# Patient Record
Sex: Female | Born: 1990 | Race: Asian | Hispanic: No | Marital: Married | Smoking: Never smoker
Health system: Southern US, Community
[De-identification: ages and names within clinical notes are randomized; demographics above are authoritative.]

## PROBLEM LIST (undated history)

## (undated) ENCOUNTER — Inpatient Hospital Stay (HOSPITAL_COMMUNITY): Payer: Self-pay

## (undated) DIAGNOSIS — J45909 Unspecified asthma, uncomplicated: Secondary | ICD-10-CM

## (undated) DIAGNOSIS — D649 Anemia, unspecified: Secondary | ICD-10-CM

## (undated) DIAGNOSIS — R55 Syncope and collapse: Secondary | ICD-10-CM

## (undated) DIAGNOSIS — Z8742 Personal history of other diseases of the female genital tract: Secondary | ICD-10-CM

## (undated) HISTORY — PX: NO PAST SURGERIES: SHX2092

## (undated) HISTORY — DX: Personal history of other diseases of the female genital tract: Z87.42

---
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
## 2009-10-14 ENCOUNTER — Emergency Department (HOSPITAL_COMMUNITY): Admission: EM | Admit: 2009-10-14 | Discharge: 2009-10-15 | Payer: Self-pay | Admitting: Emergency Medicine

## 2009-11-29 ENCOUNTER — Inpatient Hospital Stay (HOSPITAL_COMMUNITY): Admission: AD | Admit: 2009-11-29 | Discharge: 2009-11-29 | Payer: Self-pay | Admitting: Obstetrics & Gynecology

## 2009-11-29 ENCOUNTER — Ambulatory Visit: Payer: Self-pay | Admitting: Family

## 2009-12-01 ENCOUNTER — Inpatient Hospital Stay (HOSPITAL_COMMUNITY): Admission: AD | Admit: 2009-12-01 | Discharge: 2009-12-01 | Payer: Self-pay | Admitting: Obstetrics and Gynecology

## 2009-12-01 ENCOUNTER — Ambulatory Visit: Payer: Self-pay | Admitting: Family Medicine

## 2009-12-05 ENCOUNTER — Inpatient Hospital Stay (HOSPITAL_COMMUNITY): Admission: AD | Admit: 2009-12-05 | Discharge: 2009-12-05 | Payer: Self-pay | Admitting: Obstetrics & Gynecology

## 2009-12-08 ENCOUNTER — Inpatient Hospital Stay (HOSPITAL_COMMUNITY): Admission: AD | Admit: 2009-12-08 | Discharge: 2009-12-08 | Payer: Self-pay | Admitting: Obstetrics and Gynecology

## 2009-12-12 ENCOUNTER — Inpatient Hospital Stay (HOSPITAL_COMMUNITY): Admission: AD | Admit: 2009-12-12 | Discharge: 2009-12-12 | Payer: Self-pay | Admitting: Obstetrics & Gynecology

## 2009-12-14 ENCOUNTER — Ambulatory Visit (HOSPITAL_COMMUNITY): Admission: AD | Admit: 2009-12-14 | Discharge: 2009-12-14 | Payer: Self-pay | Admitting: Obstetrics & Gynecology

## 2009-12-22 ENCOUNTER — Ambulatory Visit: Payer: Self-pay | Admitting: Obstetrics & Gynecology

## 2010-09-18 IMAGING — US US OB TRANSVAGINAL MODIFY
1 series · 14 of 28 positions shown · non-contrast
Comparison: None

CLINICAL DATA: Positive pregnancy test.  Spotting.  Cramping. Beta
HCG of 5335

OB ULTRASOUND < 14 WEEKS, TRANSABDOMINAL AND TRANSVAGINAL
TECHNIQUE: Multiplanar gray scale ultrasound of the uterus and
adnexa was performed from a transvaginal and transabdominal
approach.

[Series 1: us ob comp less 14 wks · 0.21mm/px · 14 of 38 slices shown]
[im 2/38]
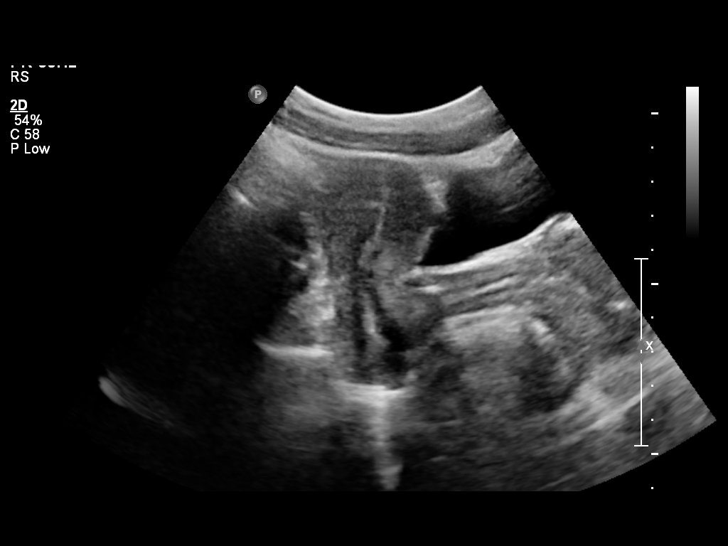
[im 5/38]
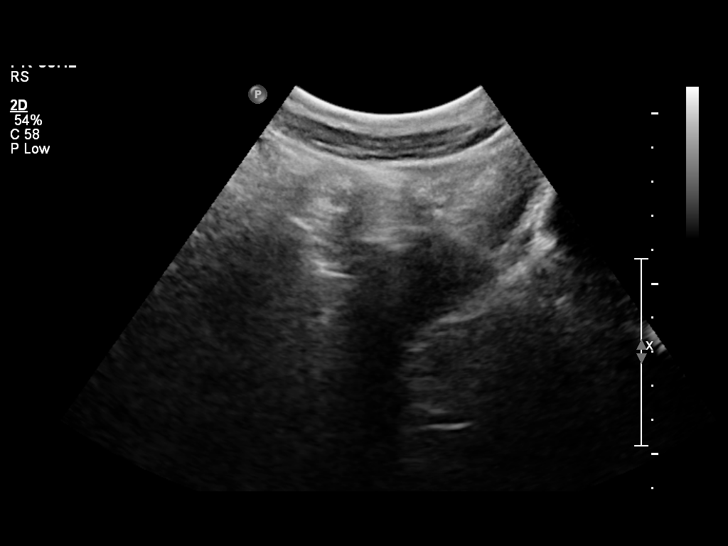
[im 7/38]
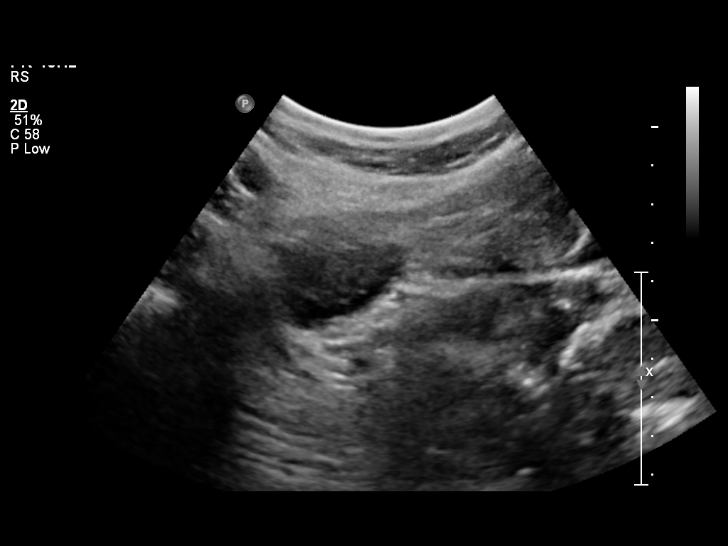
[im 10/38]
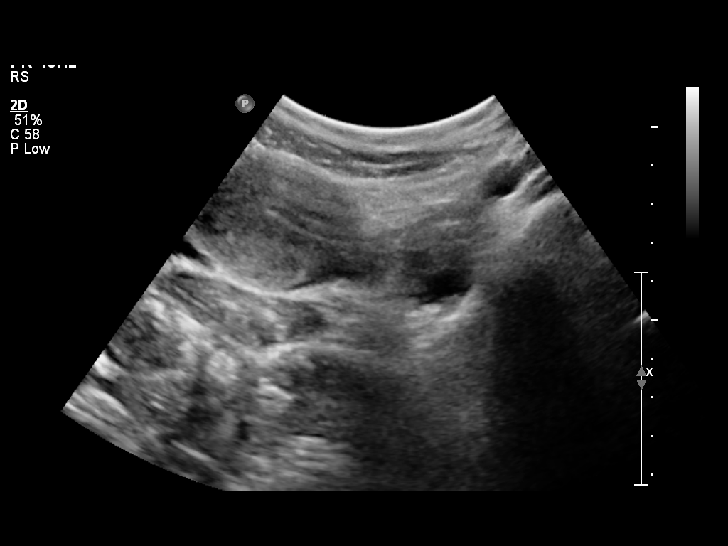
[im 13/38]
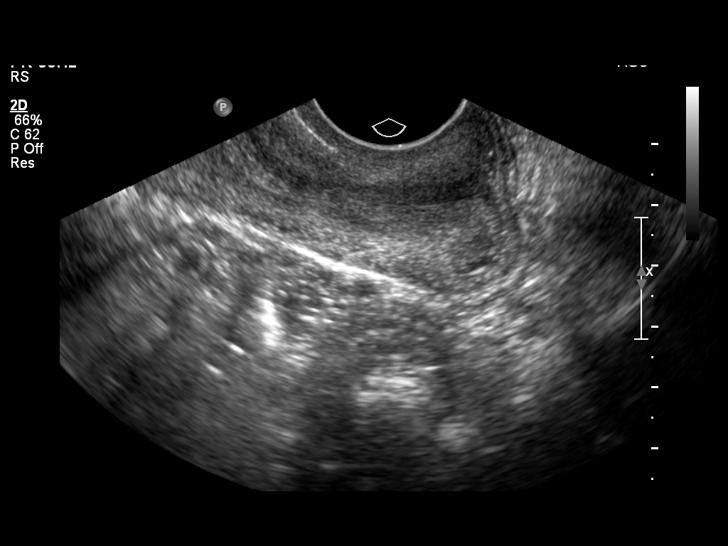
[im 16/38]
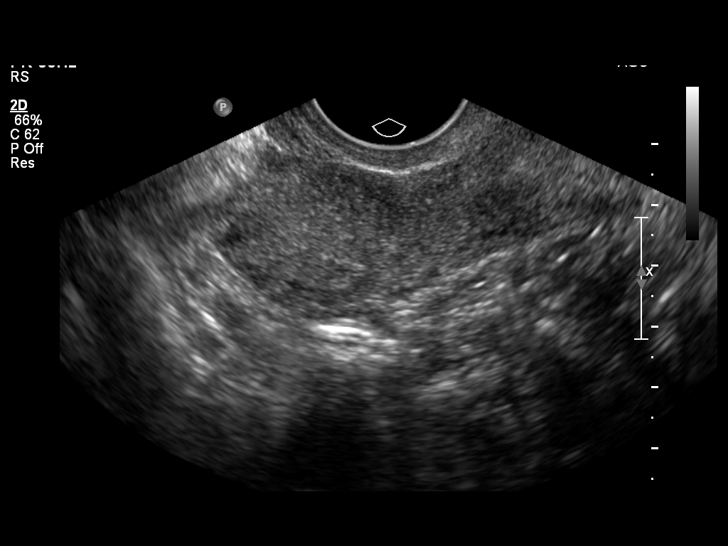
[im 18/38]
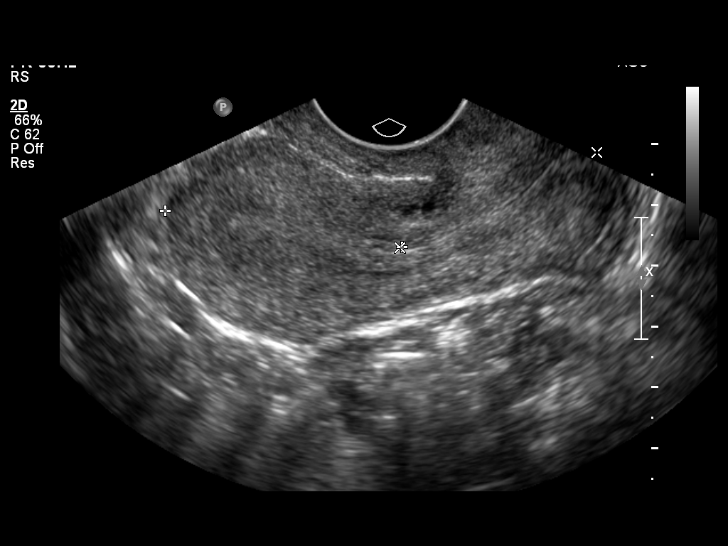
[im 21/38]
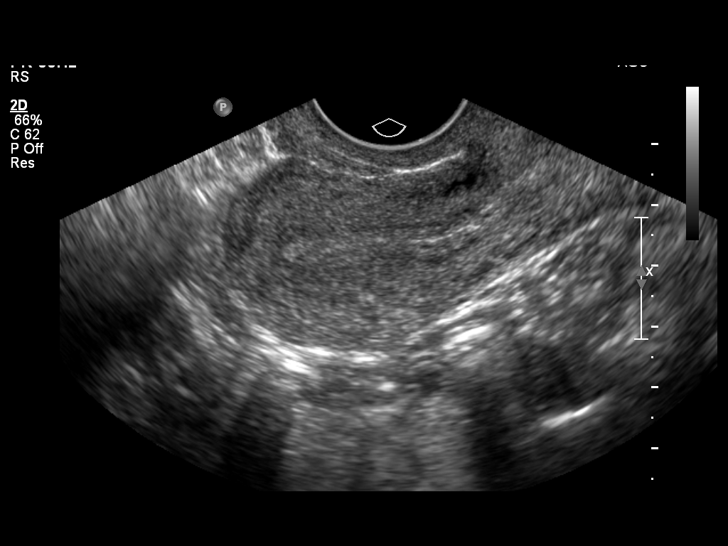
[im 24/38]
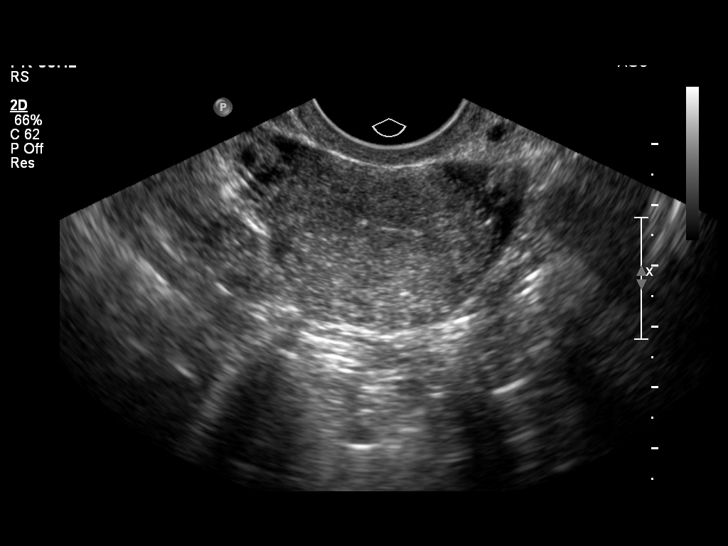
[im 27/38]
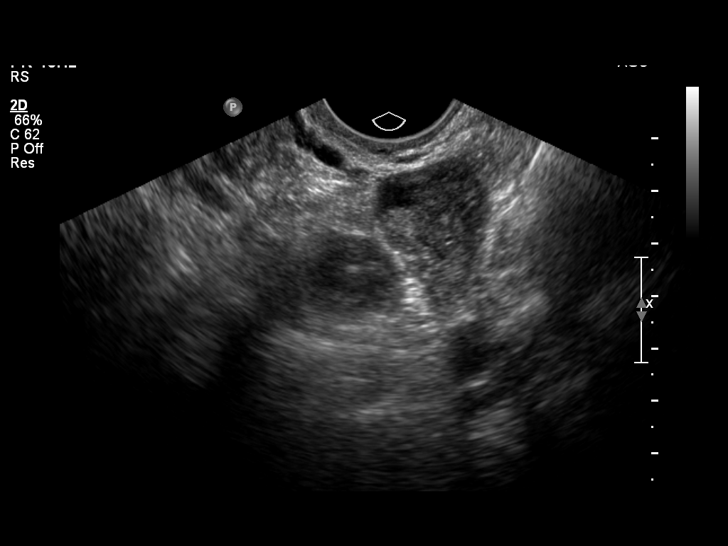
[im 29/38]
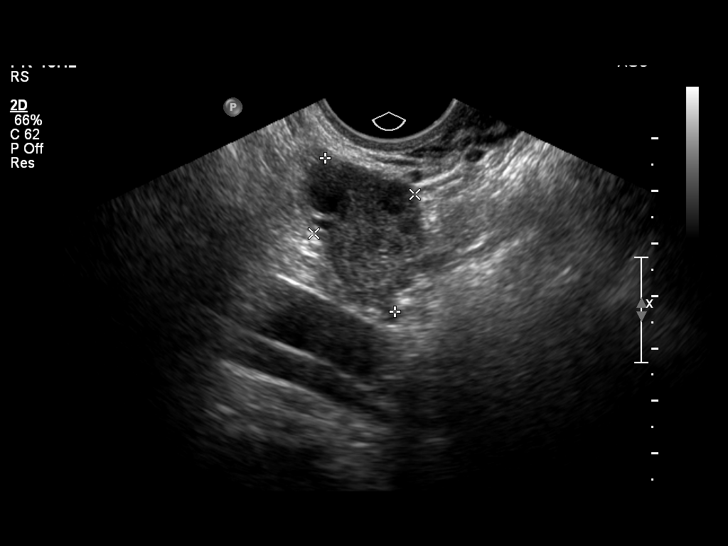
[im 32/38]
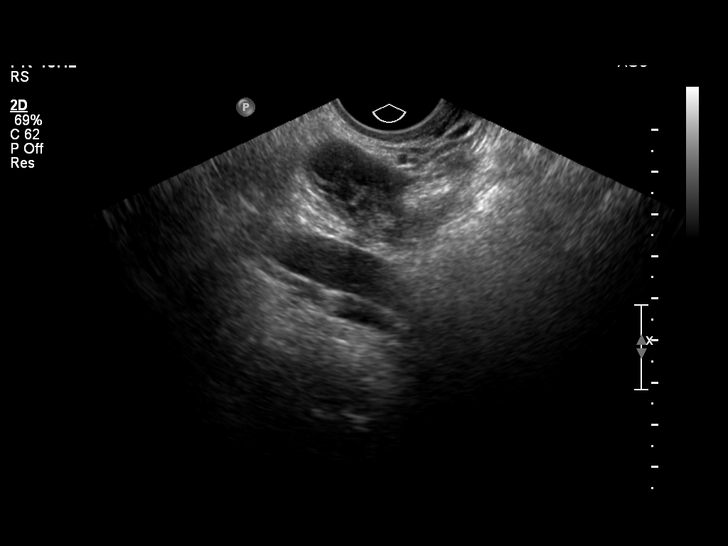
[im 35/38]
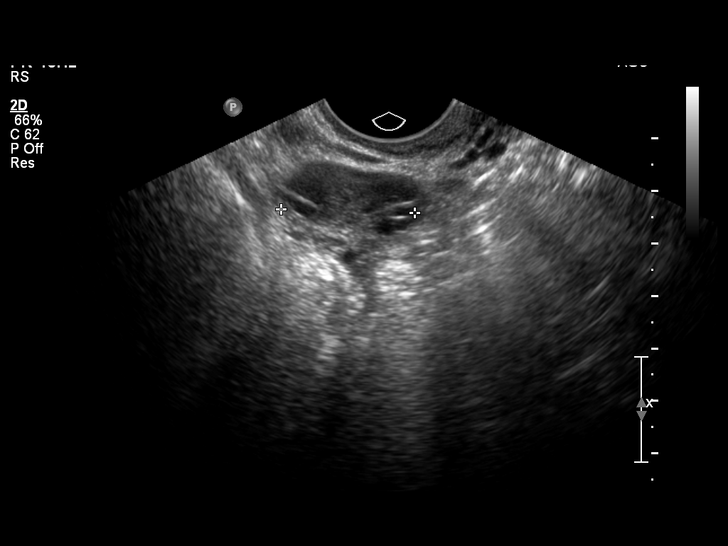
[im 38/38]
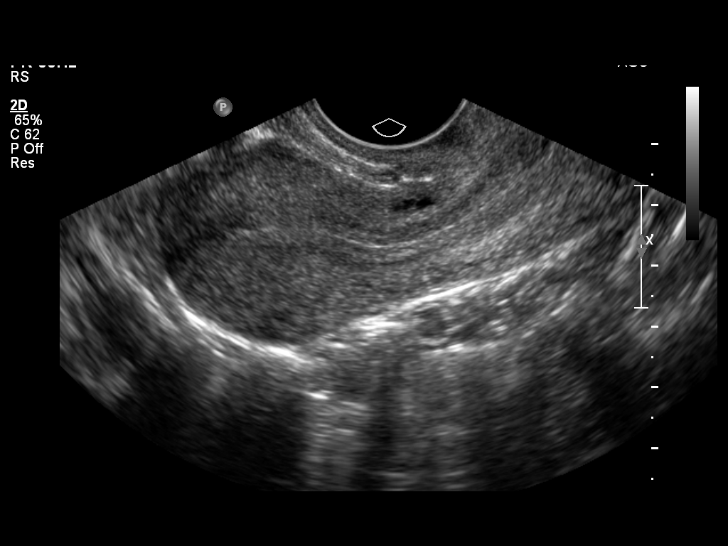

[14 of 28 positions shown; findings below may reference images not displayed]

FINDINGS: No intrauterine gestational sac identified.  Endometrium
at 5 - 6 mm, normal for age.  No yolk sac, fetal pole, or cardiac
activity identified.

The left ovary measures 2.2 x 3.2 x 2.1 cm and is normal in
morphology.

The right ovary measures 2.5 x 3.6 x 1.8 cm and is normal in
morphology.

No significant free fluid.

IMPRESSION
Normal pelvic ultrasound for patient age.  No evidence of
intrauterine gestation or adnexal/ovarian mass.  Differential
considerations include early intrauterine pregnancy, spontaneous
abortion, and ectopic pregnancy.  Recommend ultrasound and/or
clinical follow-up.

## 2011-02-14 LAB — CBC
HCT: 34.2 % — ABNORMAL LOW (ref 36.0–46.0)
HCT: 36.5 % (ref 36.0–46.0)
Hemoglobin: 12 g/dL (ref 12.0–15.0)
MCHC: 33.7 g/dL (ref 30.0–36.0)
MCHC: 33.9 g/dL (ref 30.0–36.0)
MCV: 91.4 fL (ref 78.0–100.0)
MCV: 92.3 fL (ref 78.0–100.0)
Platelets: 197 10*3/uL (ref 150–400)
RBC: 3.99 MIL/uL (ref 3.87–5.11)
RDW: 13.5 % (ref 11.5–15.5)
RDW: 13.9 % (ref 11.5–15.5)
WBC: 5.3 10*3/uL (ref 4.0–10.5)
WBC: 6.4 10*3/uL (ref 4.0–10.5)

## 2011-02-14 LAB — POCT PREGNANCY, URINE: Preg Test, Ur: POSITIVE

## 2011-02-14 LAB — AST
AST: 20 U/L (ref 0–37)
AST: 25 U/L (ref 0–37)

## 2011-02-14 LAB — WET PREP, GENITAL: Yeast Wet Prep HPF POC: NONE SEEN

## 2011-02-14 LAB — DIFFERENTIAL
Eosinophils Absolute: 0.1 10*3/uL (ref 0.0–0.7)
Eosinophils Relative: 1 % (ref 0–5)
Lymphs Abs: 1.9 10*3/uL (ref 0.7–4.0)
Monocytes Relative: 9 % (ref 3–12)

## 2011-02-14 LAB — CREATININE, SERUM
Creatinine, Ser: 0.43 mg/dL (ref 0.4–1.2)
GFR calc non Af Amer: >60 mL/min (ref >60)
GFR calc non Af Amer: >60 mL/min (ref >60)

## 2011-02-14 LAB — URINALYSIS, ROUTINE W REFLEX MICROSCOPIC: pH: 7.5 (ref 5.0–8.0)

## 2011-02-14 LAB — HCG, QUANTITATIVE, PREGNANCY
hCG, Beta Chain, Quant, S: 1609 m[IU]/mL — ABNORMAL HIGH (ref <5)
hCG, Beta Chain, Quant, S: 1915 m[IU]/mL — ABNORMAL HIGH (ref <5)
hCG, Beta Chain, Quant, S: 948 m[IU]/mL — ABNORMAL HIGH (ref <5)

## 2011-02-14 LAB — GC/CHLAMYDIA PROBE AMP, GENITAL: GC Probe Amp, Genital: NEGATIVE

## 2011-02-14 LAB — BUN: BUN: 6 mg/dL (ref 6–23)

## 2011-02-15 LAB — HCG, QUANTITATIVE, PREGNANCY: hCG, Beta Chain, Quant, S: 924 m[IU]/mL — ABNORMAL HIGH (ref <5)

## 2011-03-03 LAB — URINALYSIS, ROUTINE W REFLEX MICROSCOPIC
Bilirubin Urine: NEGATIVE
Hgb urine dipstick: NEGATIVE
Nitrite: NEGATIVE
Specific Gravity, Urine: 1.027 (ref 1.005–1.030)
pH: 6 (ref 5.0–8.0)

## 2011-03-03 LAB — DIFFERENTIAL
Eosinophils Absolute: 0 10*3/uL (ref 0.0–0.7)
Lymphocytes Relative: 19 % (ref 12–46)
Lymphs Abs: 1.4 10*3/uL (ref 0.7–4.0)
Monocytes Relative: 3 % (ref 3–12)
Neutro Abs: 5.5 10*3/uL (ref 1.7–7.7)
Neutrophils Relative %: 76 % (ref 43–77)

## 2011-03-03 LAB — POCT I-STAT, CHEM 8
BUN: 6 mg/dL (ref 6–23)
Chloride: 105 mEq/L (ref 96–112)
Creatinine, Ser: 0.5 mg/dL (ref 0.4–1.2)
Glucose, Bld: 96 mg/dL (ref 70–99)
Potassium: 3.7 mEq/L (ref 3.5–5.1)

## 2011-03-03 LAB — CBC
MCV: 92.4 fL (ref 78.0–100.0)
RBC: 4.11 MIL/uL (ref 3.87–5.11)
WBC: 7.2 10*3/uL (ref 4.0–10.5)

## 2011-08-24 LAB — URINALYSIS, ROUTINE W REFLEX MICROSCOPIC
Glucose, UA: NEGATIVE
Nitrite: NEGATIVE
Specific Gravity, Urine: 1.019
pH: 6.5

## 2011-08-24 LAB — GC/CHLAMYDIA PROBE AMP, GENITAL
Chlamydia, DNA Probe: POSITIVE — AB
GC Probe Amp, Genital: NEGATIVE

## 2011-08-24 LAB — WET PREP, GENITAL: Trich, Wet Prep: NONE SEEN

## 2011-08-24 LAB — URINE MICROSCOPIC-ADD ON

## 2011-08-24 LAB — URINE CULTURE

## 2011-08-25 LAB — POCT URINALYSIS DIP (DEVICE)
Bilirubin Urine: NEGATIVE
Ketones, ur: NEGATIVE
Operator id: 247071
Protein, ur: NEGATIVE
Specific Gravity, Urine: 1.02
pH: 7

## 2011-08-25 LAB — HCG, SERUM, QUALITATIVE: Preg, Serum: NEGATIVE

## 2013-09-06 ENCOUNTER — Encounter (HOSPITAL_COMMUNITY): Payer: Self-pay | Admitting: *Deleted

## 2013-09-06 ENCOUNTER — Inpatient Hospital Stay (HOSPITAL_COMMUNITY): Payer: 59

## 2013-09-06 ENCOUNTER — Inpatient Hospital Stay (HOSPITAL_COMMUNITY)
Admission: AD | Admit: 2013-09-06 | Discharge: 2013-09-06 | Disposition: A | Discharge status: Home / Self Care | Payer: 59 | Source: Ambulatory Visit | Attending: Obstetrics & Gynecology | Admitting: Obstetrics & Gynecology

## 2013-09-06 DIAGNOSIS — O99891 Other specified diseases and conditions complicating pregnancy: Secondary | ICD-10-CM | POA: Insufficient documentation

## 2013-09-06 DIAGNOSIS — R109 Unspecified abdominal pain: Secondary | ICD-10-CM | POA: Insufficient documentation

## 2013-09-06 DIAGNOSIS — O26899 Other specified pregnancy related conditions, unspecified trimester: Secondary | ICD-10-CM

## 2013-09-06 HISTORY — DX: Syncope and collapse: R55

## 2013-09-06 HISTORY — DX: Unspecified asthma, uncomplicated: J45.909

## 2013-09-06 HISTORY — DX: Anemia, unspecified: D64.9

## 2013-09-06 LAB — URINALYSIS, ROUTINE W REFLEX MICROSCOPIC
Leukocytes, UA: NEGATIVE
Nitrite: NEGATIVE

## 2013-09-06 LAB — WET PREP, GENITAL: Yeast Wet Prep HPF POC: NONE SEEN

## 2013-09-06 LAB — CBC
HCT: 35.5 % — ABNORMAL LOW (ref 36.0–46.0)
Hemoglobin: 12.2 g/dL (ref 12.0–15.0)
MCHC: 34.4 g/dL (ref 30.0–36.0)
RBC: 4.02 MIL/uL (ref 3.87–5.11)

## 2013-09-06 LAB — HCG, QUANTITATIVE, PREGNANCY: hCG, Beta Chain, Quant, S: 1681 m[IU]/mL — ABNORMAL HIGH (ref <5)

## 2013-09-06 MED ORDER — ONDANSETRON 8 MG PO TBDP
8.0000 mg | ORAL_TABLET | Freq: Once | ORAL | Status: AC
  Administered 2013-09-06: 8 mg via ORAL
  Filled 2013-09-06: qty 1

## 2013-09-06 NOTE — MAU Provider Note (Signed)
History     CSN: [id number]  Arrival date and time: 09/06/13 1478   First Provider Initiated Contact with Patient 09/06/13 0901      Chief Complaint  Patient presents with  . Abdominal Cramping   HPI  Pt has hx of positive pregnancy test with LMP?07/31/2013.  Pt started having abdominal cramping last night at 11 pm Pt's hx if significant for hx of ectopic pregnancy with MTX and also hx of preterm labor at 19 weeks (carried to 38 weeks). Pt denies UTI symptoms, constipation, diarrhea.  Rn note: Deloris Carolann Littler, RN Registered Nurse Signed  MAU Note Service date: 09/06/2013 8:24 AM   C/o abdominal cramping since 2300 last night;     Past Medical History  Diagnosis Date  . Anemia   . Asthma   . Syncope     Past Surgical History  Procedure Laterality Date  . No past surgeries      Family History  Problem Relation Age of Onset  . Cancer Mother   . Hyperlipidemia Maternal Grandmother     History  Substance Use Topics  . Smoking status: Never Smoker   . Smokeless tobacco: Not on file  . Alcohol Use: No    Allergies:  Allergies  Allergen Reactions  . Fish Allergy Anaphylaxis    Prescriptions prior to admission  Medication Sig Dispense Refill  . Prenatal Vit-Fe Fumarate-FA (PRENATAL MULTIVITAMIN) TABS tablet Take 1 tablet by mouth daily at 12 noon.        ROS Physical Exam   Blood pressure 119/76, pulse 74, temperature 98.2 F (36.8 C), resp. rate 18, height 5\' 6"  (1.676 m), weight 52.164 kg (115 lb), last menstrual period 07/31/2013.  Physical Exam  Nursing note and vitals reviewed. Constitutional: She appears well-developed and well-nourished. No distress.  HENT:  Head: Normocephalic.  Eyes: Pupils are equal, round, and reactive to light.  Neck: Normal range of motion. Neck supple.  Cardiovascular: Normal rate.   Respiratory: Effort normal.  GI: Soft. She exhibits no distension. There is no tenderness. There is no rebound and no guarding.   Genitourinary: Vagina normal and uterus normal.  Musculoskeletal: Normal range of motion.  Neurological: She is alert.  Skin: Skin is warm and dry.  Psychiatric: She has a normal mood and affect.    MAU Course  Procedures Results for orders placed during the hospital encounter of 09/06/13 (from the past 24 hour(s))  CBC     Status: Abnormal   Collection Time    09/06/13  9:15 AM      Result Value Range   WBC 6.0  4.0 - 10.5 K/uL   RBC 4.02  3.87 - 5.11 MIL/uL   Hemoglobin 12.2  12.0 - 15.0 g/dL   HCT 29.5 (*) 62.1 - 30.8 %   MCV 88.3  78.0 - 100.0 fL   MCH 30.3  26.0 - 34.0 pg   MCHC 34.4  30.0 - 36.0 g/dL   RDW 65.7  84.6 - 96.2 %   Platelets 187  150 - 400 K/uL  HCG, QUANTITATIVE, PREGNANCY     Status: Abnormal   Collection Time    09/06/13  9:15 AM      Result Value Range   hCG, Beta Chain, Quant, S 1681 (*) <5 mIU/mL  URINALYSIS, ROUTINE W REFLEX MICROSCOPIC     Status: None   Collection Time    09/06/13  9:20 AM      Result Value Range   Color, Urine YELLOW  YELLOW   APPearance CLEAR  CLEAR   Specific Gravity, Urine 1.010  1.005 - 1.030   pH 6.5  5.0 - 8.0   Glucose, UA NEGATIVE  NEGATIVE mg/dL   Hgb urine dipstick NEGATIVE  NEGATIVE   Bilirubin Urine NEGATIVE  NEGATIVE   Ketones, ur NEGATIVE  NEGATIVE mg/dL   Protein, ur NEGATIVE  NEGATIVE mg/dL   Urobilinogen, UA 0.2  0.0 - 1.0 mg/dL   Nitrite NEGATIVE  NEGATIVE   Leukocytes, UA NEGATIVE  NEGATIVE  WET PREP, GENITAL     Status: Abnormal   Collection Time    09/06/13 10:05 AM      Result Value Range   Yeast Wet Prep HPF POC NONE SEEN  NONE SEEN   Trich, Wet Prep NONE SEEN  NONE SEEN   Clue Cells Wet Prep HPF POC FEW (*) NONE SEEN   WBC, Wet Prep HPF POC TOO NUMEROUS TO COUNT (*) NONE SEEN  US Ob Comp Less 14 Wks  09/06/2013   CLINICAL DATA:  Cramping.  EXAM: OBSTETRIC <14 WK [place] AND TRANSVAGINAL OB US  TECHNIQUE: Both transabdominal and transvaginal ultrasound examinations were performed for complete  evaluation of the gestation as well as the maternal uterus, adnexal regions, and pelvic cul-de-sac. Transvaginal technique was performed to assess early pregnancy.  COMPARISON:  None.  FINDINGS: Intrauterine gestational sac: Not definitely visualized however there is a tiny 0.4 x 0.2 x 0.2 cm oval hypoechoic collection within the uterus possibly representing a very early gestational sac.  Yolk sac:  Not present  Embryo:  Not present  Cardiac Activity: None  Heart Rate: 0 bpm  MSD: 2.7  mm   4 w   5  d  Maternal uterus/adnexae: The right ovary measures 3.6 x 2.5 x 2.3 cm. Simple cyst within the right ovary. The left ovary measures 3.2 x 2.2 x 1.8 cm.  IMPRESSION: Tiny cystic structure within the endometrium may represent a very small gestational sac. However as this is not definitive for this etiology, in the setting of positive pregnancy test and no definite intrauterine pregnancy, this reflects a pregnancy of unknown location. Differential considerations include early normal IUP, abnormal IUP, or nonvisualized ectopic pregnancy. Differentiation is achieved with serial beta HCG supplemented by repeat sonography as clinically warranted.  Discussed with Pamelia Hoit, NP at 11:30 a.m. on September 06, 2013.   Electronically Signed   By: Annia Belt M.D.   On: 09/06/2013 11:35   US Ob Transvaginal  09/06/2013   CLINICAL DATA:  Cramping.  EXAM: OBSTETRIC <14 WK [place] AND TRANSVAGINAL OB US  TECHNIQUE: Both transabdominal and transvaginal ultrasound examinations were performed for complete evaluation of the gestation as well as the maternal uterus, adnexal regions, and pelvic cul-de-sac. Transvaginal technique was performed to assess early pregnancy.  COMPARISON:  None.  FINDINGS: Intrauterine gestational sac: Not definitely visualized however there is a tiny 0.4 x 0.2 x 0.2 cm oval hypoechoic collection within the uterus possibly representing a very early gestational sac.  Yolk sac:  Not present  Embryo:  Not present   Cardiac Activity: None  Heart Rate: 0 bpm  MSD: 2.7  mm   4 w   5  d  Maternal uterus/adnexae: The right ovary measures 3.6 x 2.5 x 2.3 cm. Simple cyst within the right ovary. The left ovary measures 3.2 x 2.2 x 1.8 cm.  IMPRESSION: Tiny cystic structure within the endometrium may represent a very small gestational sac. However as this is not definitive  for this etiology, in the setting of positive pregnancy test and no definite intrauterine pregnancy, this reflects a pregnancy of unknown location. Differential considerations include early normal IUP, abnormal IUP, or nonvisualized ectopic pregnancy. Differentiation is achieved with serial beta HCG supplemented by repeat sonography as clinically warranted.  Discussed with Pamelia Hoit, NP at 11:30 a.m. on September 06, 2013.   Electronically Signed   By: Annia Belt M.D.   On: 09/06/2013 11:35    Assessment and Plan  Abdominal pain in pregnancy ?IUGS- f/u 48 hrs for repeat BetaHCG- sooner if pain or bleeding Pt will need close follow up - hx PTL @19weeks   Catarino Vold 09/06/2013, 9:10 AM

## 2013-09-06 NOTE — MAU Note (Signed)
C/o abdominal cramping since 2300 last night;

## 2013-09-07 LAB — GC/CHLAMYDIA PROBE AMP: CT Probe RNA: NEGATIVE

## 2013-09-08 ENCOUNTER — Inpatient Hospital Stay (HOSPITAL_COMMUNITY)
Admission: AD | Admit: 2013-09-08 | Discharge: 2013-09-08 | Disposition: A | Discharge status: Home / Self Care | Payer: 59 | Source: Ambulatory Visit | Attending: Obstetrics & Gynecology | Admitting: Obstetrics & Gynecology

## 2013-09-08 ENCOUNTER — Encounter (HOSPITAL_COMMUNITY): Payer: Self-pay

## 2013-09-08 DIAGNOSIS — R109 Unspecified abdominal pain: Secondary | ICD-10-CM | POA: Insufficient documentation

## 2013-09-08 DIAGNOSIS — O0281 Inappropriate change in quantitative human chorionic gonadotropin (hCG) in early pregnancy: Secondary | ICD-10-CM | POA: Insufficient documentation

## 2013-09-08 DIAGNOSIS — O99891 Other specified diseases and conditions complicating pregnancy: Secondary | ICD-10-CM | POA: Insufficient documentation

## 2013-09-08 DIAGNOSIS — Z3201 Encounter for pregnancy test, result positive: Secondary | ICD-10-CM

## 2013-09-08 NOTE — MAU Provider Note (Signed)
Attestation of Attending Supervision of Advanced Practitioner (PA/CNM/NP): Evaluation and management procedures were performed by the Advanced Practitioner under my supervision and collaboration.  I have reviewed the Advanced Practitioner's note and chart, and I agree with the management and plan.  Mikaeel Petrow, MD, FACOG Attending Obstetrician & Gynecologist Faculty Practice, [facility]  

## 2013-09-08 NOTE — MAU Note (Signed)
Pt here for f/u only, denies bleeding, does note mild cramping.

## 2013-09-08 NOTE — MAU Provider Note (Signed)
S: 22 y.o. [id number] @[redacted]w[redacted]d  by LMP presents for follow up quant hcg.  On 09/06/13, she had quant hcg of 1681.  U/S showed possible IUGS but inconclusive.  Pt reports mild cramping today, denies bleeding, severe pain, n/v, dizziness, or fever/chills.  O: BP 116/71  Pulse 80  Temp(Src) 97.8 F (36.6 C) (Oral)  Resp 16  Ht 5\' 6"  (1.676 m)  Wt 52.164 kg (115 lb)  BMI 18.57 kg/m2  LMP 07/31/2013  Results for orders placed during the hospital encounter of 09/08/13 (from the past 24 hour(s))  HCG, QUANTITATIVE, PREGNANCY     Status: Abnormal   Collection Time    09/08/13  8:12 AM      Result Value Range   hCG, Beta Chain, Quant, S 4138 (*) <5 mIU/mL   A:  1. Positive blood pregnancy test   2.  Appropriate rise in quant hcg in 48 hours  P: D/C home with bleeding/ectopic precautions Repeat U/S 1 week from previous imaging, on Thursday 10/16 Pt given list of OB providers Return to MAU as needed  Sharen Counter Certified Nurse-Midwife

## 2013-09-10 ENCOUNTER — Other Ambulatory Visit (HOSPITAL_COMMUNITY): Payer: Self-pay | Admitting: Advanced Practice Midwife

## 2013-09-10 DIAGNOSIS — Z3201 Encounter for pregnancy test, result positive: Secondary | ICD-10-CM

## 2013-09-14 ENCOUNTER — Ambulatory Visit (HOSPITAL_COMMUNITY): Payer: 59 | Attending: Advanced Practice Midwife

## 2013-10-16 LAB — OB RESULTS CONSOLE RPR: RPR: NONREACTIVE

## 2013-10-16 LAB — OB RESULTS CONSOLE ABO/RH: RH Type: POSITIVE

## 2013-10-16 LAB — OB RESULTS CONSOLE RUBELLA ANTIBODY, IGM: RUBELLA: IMMUNE

## 2013-10-16 LAB — OB RESULTS CONSOLE HIV ANTIBODY (ROUTINE TESTING): HIV: NONREACTIVE

## 2013-10-16 LAB — OB RESULTS CONSOLE HEPATITIS B SURFACE ANTIGEN: HEP B S AG: NEGATIVE

## 2013-10-16 LAB — OB RESULTS CONSOLE ANTIBODY SCREEN: ANTIBODY SCREEN: NEGATIVE

## 2013-10-16 LAB — OB RESULTS CONSOLE GC/CHLAMYDIA
CHLAMYDIA, DNA PROBE: NEGATIVE
Gonorrhea: NEGATIVE

## 2013-10-16 LAB — OB RESULTS CONSOLE GBS: GBS: NEGATIVE

## 2013-11-29 NOTE — L&D Delivery Note (Signed)
Delivery Note Pt reached complete dilation and pushed well.  At 11:46 AM a healthy female was delivered via Vaginal, Spontaneous Delivery (Presentation: Left Occiput Anterior).  APGAR: 9, 9; weight pending .   Placenta status: Intact, Spontaneous.  Cord: 3 vessels with the following complications: None.    Anesthesia: Epidural  Episiotomy: None Lacerations: 1st degree Suture Repair: 3.0 monocryl Est. Blood Loss (mL): 350cc  Mom to postpartum.  Baby to Couplet care / Skin to Skin.  Oliver Pila 05/06/2014, 12:12 PM

## 2014-01-06 ENCOUNTER — Inpatient Hospital Stay (HOSPITAL_COMMUNITY)
Admission: AD | Admit: 2014-01-06 | Discharge: 2014-01-06 | Disposition: A | Discharge status: Home / Self Care | Payer: 59 | Source: Ambulatory Visit | Attending: Obstetrics and Gynecology | Admitting: Obstetrics and Gynecology

## 2014-01-06 ENCOUNTER — Encounter (HOSPITAL_COMMUNITY): Payer: Self-pay

## 2014-01-06 DIAGNOSIS — O9989 Other specified diseases and conditions complicating pregnancy, childbirth and the puerperium: Principal | ICD-10-CM

## 2014-01-06 DIAGNOSIS — R058 Other specified cough: Secondary | ICD-10-CM

## 2014-01-06 DIAGNOSIS — J069 Acute upper respiratory infection, unspecified: Secondary | ICD-10-CM | POA: Insufficient documentation

## 2014-01-06 DIAGNOSIS — R059 Cough, unspecified: Secondary | ICD-10-CM | POA: Insufficient documentation

## 2014-01-06 DIAGNOSIS — R05 Cough: Secondary | ICD-10-CM | POA: Insufficient documentation

## 2014-01-06 DIAGNOSIS — O99891 Other specified diseases and conditions complicating pregnancy: Secondary | ICD-10-CM | POA: Insufficient documentation

## 2014-01-06 DIAGNOSIS — R55 Syncope and collapse: Secondary | ICD-10-CM

## 2014-01-06 DIAGNOSIS — J029 Acute pharyngitis, unspecified: Secondary | ICD-10-CM

## 2014-01-06 DIAGNOSIS — O2650 Maternal hypotension syndrome, unspecified trimester: Secondary | ICD-10-CM | POA: Insufficient documentation

## 2014-01-06 LAB — URINE MICROSCOPIC-ADD ON

## 2014-01-06 LAB — URINALYSIS, ROUTINE W REFLEX MICROSCOPIC
Bilirubin Urine: NEGATIVE
Glucose, UA: NEGATIVE mg/dL
Hgb urine dipstick: NEGATIVE
KETONES UR: NEGATIVE mg/dL
NITRITE: NEGATIVE
PH: 7 (ref 5.0–8.0)
Protein, ur: NEGATIVE mg/dL
SPECIFIC GRAVITY, URINE: 1.02 (ref 1.005–1.030)
Urobilinogen, UA: 0.2 mg/dL (ref 0.0–1.0)

## 2014-01-06 LAB — RAPID STREP SCREEN (MED CTR MEBANE ONLY): Streptococcus, Group A Screen (Direct): NEGATIVE

## 2014-01-06 LAB — INFLUENZA PANEL BY PCR (TYPE A & B)
H1N1 flu by pcr: NOT DETECTED
Influenza A By PCR: NEGATIVE
Influenza B By PCR: NEGATIVE

## 2014-01-06 MED ORDER — LACTATED RINGERS IV BOLUS (SEPSIS)
1000.0000 mL | Freq: Once | INTRAVENOUS | Status: AC
  Administered 2014-01-06: 1000 mL via INTRAVENOUS

## 2014-01-06 MED ORDER — BENZONATATE 100 MG PO CAPS: 100.0000 mg | ORAL_CAPSULE | Freq: Three times a day (TID) | ORAL | Status: DC | PRN

## 2014-01-06 MED ORDER — GUAIFENESIN 400 MG PO TABS: 800.0000 mg | ORAL_TABLET | ORAL | Status: DC

## 2014-01-06 MED ORDER — AMOXICILLIN 500 MG PO CAPS: 500.0000 mg | ORAL_CAPSULE | Freq: Two times a day (BID) | ORAL | Status: DC

## 2014-01-06 NOTE — MAU Provider Note (Signed)
Chief Complaint:  Cough and Near Syncope   First Provider Initiated Contact with Patient 01/06/14 0601      HPI: Elizabeth Villanueva is a 23 y.o. [phone] at 74w5dwho presents to maternity admissions reporting to MAU with respiratory infection, cough, and near syncope today after coughing spell.  She denies SOB now.  The respiratory infection started on 12/29/13 with sore throat and cough.  She reports fever at home 2 days ago, none since.  Her son had an ear infection 1 week ago.  She reports good fetal movement, denies abdominal pain, LOF, vaginal bleeding, vaginal itching/burning, urinary symptoms, h/a, dizziness, n/v, or fever/chills.     Past Medical History: Past Medical History  Diagnosis Date  . Anemia   . Asthma   . Syncope     Past obstetric history: OB History  Gravida Para Term Preterm AB SAB TAB Ectopic Multiple Living  4 1 1  2 2    1     # Outcome Date GA Lbr Len/2nd Weight Sex Delivery Anes PTL Lv  4 CUR           3 SAB           2 SAB           1 TRM     M SVD   Y      Past Surgical History: Past Surgical History  Procedure Laterality Date  . No past surgeries      Family History: Family History  Problem Relation Age of Onset  . Cancer Mother   . Hyperlipidemia Maternal Grandmother     Social History: History  Substance Use Topics  . Smoking status: Never Smoker   . Smokeless tobacco: Not on file  . Alcohol Use: No    Allergies:  Allergies  Allergen Reactions  . Fish Allergy Anaphylaxis    Meds:  Prescriptions prior to admission  Medication Sig Dispense Refill  . DM-Doxylamine-Acetaminophen (TYLENOL WARMING COUGH NIGHT) 30-12.03-999 MG/30ML LIQD Take 30 mLs by mouth.      . Prenatal Vit-Fe Fumarate-FA (PRENATAL MULTIVITAMIN) TABS tablet Take 1 tablet by mouth daily at 12 noon.      . [DISCONTINUED] guaiFENesin (ROBITUSSIN) 100 MG/5ML liquid Take 200 mg by mouth 3 (three) times daily as needed for cough.        ROS: Pertinent findings in history  of present illness.  Physical Exam  Blood pressure 109/72, pulse 89, temperature 98.2 F (36.8 C), temperature source Oral, resp. rate 18, height 5\' 6"  (1.676 m), weight 65.772 kg (145 lb), last menstrual period 07/31/2013, SpO2 100.00%. GENERAL: Well-developed, well-nourished female in no acute distress.  HEENT: normocephalic HEART: normal rate RESP: normal effort ABDOMEN: Soft, non-tender, gravid appropriate for gestational age EXTREMITIES: Nontender, no edema NEURO: alert and oriented SPECULUM EXAM: NEFG, physiologic discharge, no blood, cervix clean    FHT:  145 by doppler   Labs: Results for orders placed during the hospital encounter of 01/06/14 (from the past 24 hour(s))  URINALYSIS, ROUTINE W REFLEX MICROSCOPIC     Status: Abnormal   Collection Time    01/06/14  5:36 AM      Result Value Range   Color, Urine YELLOW  YELLOW   APPearance CLEAR  CLEAR   Specific Gravity, Urine 1.020  1.005 - 1.030   pH 7.0  5.0 - 8.0   Glucose, UA NEGATIVE  NEGATIVE mg/dL   Hgb urine dipstick NEGATIVE  NEGATIVE   Bilirubin Urine NEGATIVE  NEGATIVE  Ketones, ur NEGATIVE  NEGATIVE mg/dL   Protein, ur NEGATIVE  NEGATIVE mg/dL   Urobilinogen, UA 0.2  0.0 - 1.0 mg/dL   Nitrite NEGATIVE  NEGATIVE   Leukocytes, UA SMALL (*) NEGATIVE  URINE MICROSCOPIC-ADD ON     Status: Abnormal   Collection Time    01/06/14  5:36 AM      Result Value Range   Squamous Epithelial / LPF FEW (*) RARE   WBC, UA 3-6  <3 WBC/hpf   Bacteria, UA FEW (*) RARE   Urine-Other MUCOUS PRESENT      Assessment: 1. Protracted URI   2. Dry cough   3. Sore throat   4. Vasovagal near-syncope     Plan: LR 1000 ml x1 in MAU Consult Dr Ambrose MantleHenley Strep and Influenza rapid tests Discharge home Tessalon Perles for cough Increase PO fluids Continue Robitussin DM F/U in office next week if symptoms persist Return to MAU as needed       Follow-up Information   Follow up with Bing PlumeHENLEY,THOMAS F, MD.   Specialty:   Obstetrics and Gynecology   Contact information:   [address], [address], [address] [place] [phone]        Medication List    STOP taking these medications       guaiFENesin 100 MG/5ML liquid  Commonly known as:  ROBITUSSIN      TAKE these medications       benzonatate 100 MG capsule  Commonly known as:  TESSALON PERLES  Take 1 capsule (100 mg total) by mouth 3 (three) times daily as needed for cough.     prenatal multivitamin Tabs tablet  Take 1 tablet by mouth daily at 12 noon.     TYLENOL WARMING COUGH NIGHT 30-12.03-999 MG/30ML Liqd  Generic drug:  DM-Doxylamine-Acetaminophen  Take 30 mLs by mouth.        Elizabeth Villanueva Certified Nurse-Midwife 01/06/2014 7:24 AM

## 2014-01-06 NOTE — MAU Note (Addendum)
Pt has had cough x 1 week, productive at times. Had fever up until 2 days ago. Sore throat since 1/31. Denies abdominal pain, vaginal bleeding, LOF. Pt states this morning while walking to the bathroom got really dizzy and fell to the ground; unsure how she landed. Denies loss of consciousness.

## 2014-01-06 NOTE — Discharge Instructions (Signed)

## 2014-01-07 LAB — URINE CULTURE
COLONY COUNT: NO GROWTH
CULTURE: NO GROWTH

## 2014-01-08 LAB — CULTURE, GROUP A STREP

## 2014-01-13 ENCOUNTER — Inpatient Hospital Stay (HOSPITAL_COMMUNITY)
Admission: AD | Admit: 2014-01-13 | Discharge: 2014-01-13 | Disposition: A | Discharge status: Home / Self Care | Payer: 59 | Source: Ambulatory Visit | Attending: Obstetrics and Gynecology | Admitting: Obstetrics and Gynecology

## 2014-01-13 DIAGNOSIS — O9989 Other specified diseases and conditions complicating pregnancy, childbirth and the puerperium: Secondary | ICD-10-CM

## 2014-01-13 DIAGNOSIS — N898 Other specified noninflammatory disorders of vagina: Secondary | ICD-10-CM | POA: Insufficient documentation

## 2014-01-13 DIAGNOSIS — R109 Unspecified abdominal pain: Secondary | ICD-10-CM

## 2014-01-13 DIAGNOSIS — O99891 Other specified diseases and conditions complicating pregnancy: Secondary | ICD-10-CM | POA: Insufficient documentation

## 2014-01-13 DIAGNOSIS — O26899 Other specified pregnancy related conditions, unspecified trimester: Secondary | ICD-10-CM

## 2014-01-13 DIAGNOSIS — O4700 False labor before 37 completed weeks of gestation, unspecified trimester: Secondary | ICD-10-CM | POA: Insufficient documentation

## 2014-01-13 LAB — URINALYSIS, ROUTINE W REFLEX MICROSCOPIC
Bilirubin Urine: NEGATIVE
Glucose, UA: 250 mg/dL — AB
Hgb urine dipstick: NEGATIVE
Ketones, ur: NEGATIVE mg/dL
Leukocytes, UA: NEGATIVE
Nitrite: NEGATIVE
Protein, ur: NEGATIVE mg/dL
Specific Gravity, Urine: 1.015 (ref 1.005–1.030)
Urobilinogen, UA: 0.2 mg/dL (ref 0.0–1.0)
pH: 7 (ref 5.0–8.0)

## 2014-01-13 NOTE — MAU Provider Note (Signed)
  History     CSN: [id number]  Arrival date and time: 01/13/14 1420   First Provider Initiated Contact with Patient 01/13/14 1508      Chief Complaint  Patient presents with  . Rupture of Membranes   HPI Elizabeth Villanueva is a 23 y.o. [id number] at 5012w5d. She presents with c/o contractions x 2 days off/on. She had increased vaginal discharge today, clear and mucoid. No bleeding or spotting, no odor or itching.  No urinary urgency, frequency or burning. Good fetal movement. Last intercourse last night.    Past Medical History  Diagnosis Date  . Anemia   . Asthma   . Syncope     Past Surgical History  Procedure Laterality Date  . No past surgeries      Family History  Problem Relation Age of Onset  . Cancer Mother   . Hyperlipidemia Maternal Grandmother     History  Substance Use Topics  . Smoking status: Never Smoker   . Smokeless tobacco: Not on file  . Alcohol Use: No    Allergies:  Allergies  Allergen Reactions  . Fish Allergy Anaphylaxis    Prescriptions prior to admission  Medication Sig Dispense Refill  . albuterol (PROVENTIL HFA;VENTOLIN HFA) 108 (90 BASE) MCG/ACT inhaler Inhale 1-2 puffs into the lungs every 6 (six) hours as needed for wheezing or shortness of breath.      Marland Kitchen. amoxicillin (AMOXIL) 500 MG capsule Take 500 mg by mouth 2 (two) times daily.      . benzonatate (TESSALON PERLES) 100 MG capsule Take 1 capsule (100 mg total) by mouth 3 (three) times daily as needed for cough.  20 capsule  0  . DM-Doxylamine-Acetaminophen (TYLENOL WARMING COUGH NIGHT) 30-12.03-999 MG/30ML LIQD Take 30 mLs by mouth.      Marland Kitchen. guaiFENesin 200 MG tablet Take 400 mg by mouth every 4 (four) hours as needed for cough or to loosen phlegm.      . Prenatal Vit-Fe Fumarate-FA (PRENATAL MULTIVITAMIN) TABS tablet Take 1 tablet by mouth daily at 12 noon.        Review of Systems  Constitutional: Negative for fever and chills.  Gastrointestinal: Negative for heartburn, nausea,  vomiting, diarrhea and constipation.  Genitourinary: Negative for dysuria, urgency and frequency.       Contractions Clear, mucoid discharge   Physical Exam   Temperature 98.4 F (36.9 C), temperature source Oral, resp. rate 16, last menstrual period 07/31/2013.  Physical Exam  Constitutional: She is oriented to person, place, and time. She appears well-developed and well-nourished.  GI: Soft. There is no tenderness.  Genitourinary:  SS exam Ext gen- nl anatomy,skin intact Vagina- scant  Clear mucoid discharge Cx-parous with eversion, soft, thick, long Ut- gravid @ 24 wk size Adn- non tender  Musculoskeletal: Normal range of motion.  Neurological: She is alert and oriented to person, place, and time.  Skin: Skin is warm and dry.  Psychiatric: She has a normal mood and affect. Her behavior is normal.    MAU Course  Procedures  MDM Crist FatFern negative Ext toco- no contractions  Assessment and Plan  ASSESSMENT:  23 5/7 wks, increased vaginal discharge Contractions after intercourse Cervix long, closed  PLAN:  Pelvic rest Keep next appt in the office   Elizabeth Linker M. 01/13/2014, 3:21 PM

## 2014-01-13 NOTE — MAU Note (Signed)
23 yo, G4P1 at 7354w5d, presents to MAU with c/o questionable ROM clear fluid at 1300 today. Denies VB. Reports +FM and intermittent contractions.

## 2014-03-08 ENCOUNTER — Encounter (HOSPITAL_COMMUNITY): Payer: Self-pay

## 2014-03-08 ENCOUNTER — Inpatient Hospital Stay (HOSPITAL_COMMUNITY)
Admission: AD | Admit: 2014-03-08 | Discharge: 2014-03-08 | Disposition: A | Discharge status: Home / Self Care | Payer: 59 | Source: Ambulatory Visit | Attending: Obstetrics and Gynecology | Admitting: Obstetrics and Gynecology

## 2014-03-08 DIAGNOSIS — R8789 Other abnormal findings in specimens from female genital organs: Secondary | ICD-10-CM

## 2014-03-08 DIAGNOSIS — O4700 False labor before 37 completed weeks of gestation, unspecified trimester: Secondary | ICD-10-CM | POA: Insufficient documentation

## 2014-03-08 DIAGNOSIS — O09899 Supervision of other high risk pregnancies, unspecified trimester: Secondary | ICD-10-CM

## 2014-03-08 DIAGNOSIS — R109 Unspecified abdominal pain: Secondary | ICD-10-CM | POA: Insufficient documentation

## 2014-03-08 DIAGNOSIS — O479 False labor, unspecified: Secondary | ICD-10-CM

## 2014-03-08 LAB — WET PREP, GENITAL
Trich, Wet Prep: NONE SEEN
YEAST WET PREP: NONE SEEN

## 2014-03-08 LAB — URINALYSIS, ROUTINE W REFLEX MICROSCOPIC
Bilirubin Urine: NEGATIVE
Glucose, UA: 100 mg/dL — AB
Hgb urine dipstick: NEGATIVE
Ketones, ur: NEGATIVE mg/dL
Leukocytes, UA: NEGATIVE
NITRITE: NEGATIVE
PROTEIN: NEGATIVE mg/dL
Specific Gravity, Urine: 1.015 (ref 1.005–1.030)
UROBILINOGEN UA: 0.2 mg/dL (ref 0.0–1.0)
pH: 6 (ref 5.0–8.0)

## 2014-03-08 LAB — GLUCOSE, CAPILLARY: Glucose-Capillary: 83 mg/dL (ref 70–99)

## 2014-03-08 LAB — FETAL FIBRONECTIN: FETAL FIBRONECTIN: POSITIVE — AB

## 2014-03-08 MED ORDER — TERBUTALINE SULFATE 1 MG/ML IJ SOLN
0.2500 mg | Freq: Once | INTRAMUSCULAR | Status: AC
  Administered 2014-03-08: 0.25 mg via SUBCUTANEOUS
  Filled 2014-03-08: qty 1

## 2014-03-08 MED ORDER — GI COCKTAIL ~~LOC~~
30.0000 mL | Freq: Once | ORAL | Status: AC
  Administered 2014-03-08: 30 mL via ORAL
  Filled 2014-03-08: qty 30

## 2014-03-08 MED ORDER — BETAMETHASONE SOD PHOS & ACET 6 (3-3) MG/ML IJ SUSP
12.0000 mg | Freq: Once | INTRAMUSCULAR | Status: AC
  Administered 2014-03-08: 12 mg via INTRAMUSCULAR
  Filled 2014-03-08: qty 2

## 2014-03-08 NOTE — MAU Note (Signed)
Patient states she has been having frequent contractions since 1520 and now hurting in the back. Denies bleeding, leaking or discharge. Reports good fetal movement.

## 2014-03-08 NOTE — Discharge Instructions (Signed)
Fetal Fibronectin This is a test done to help evaluate a pregnant woman's risk of pre-term delivery. It is generally done when you are 26 to [redacted] weeks pregnant and are having symptoms of premature labor. A Dacron swab is used to take a sample of cervical or vaginal fluid from the back portion of the vagina or from the area just outside the opening of the cervix. Fetal fibronectin (fFN) is a glycoprotein that can be used to help predict the short term risk of premature delivery. fFN is produced at the boundary between the amniotic sac and the lining of the mother's uterus. This is called the unteroplacental junction. Fetal fibronectin is largely confined to this junction and thought to help maintain the integrity of the boundary. fFN is normally detectable in cervicovaginal fluid during the first 20 to 24 weeks of pregnancy, and then is detectable again after about 36 weeks.  Finding fFN in cervicovaginal fluids after 36 weeks is not unusual as it is often released by the body as it gets ready for childbirth. The elevated fFN found in vaginal fluids early in pregnancy may simply reflect the normal growth and establishment of tissues at the unteroplacental junction with levels falling when this phase is complete. What is known is that fFN that is detected between 24 and 36 weeks of pregnancy is not normal. Elevated levels reflect a disturbance at the uteroplacental junction and have been associated with an increased risk of pre-term labor and delivery. Knowing whether or not a woman is likely to deliver prematurely helps your caregiver plan a course of action. The fFN test is a relatively non-invasive tool to help the caregiver to distinguish between those who are likely to deliver shortly and those who are not.  PREPARATION FOR TEST   Inform the person conducting the test if you have a medical condition or are using any medications that cause excessive bleeding.  Do not have sexual intercourse for 24 hours  before the procedure. NORMAL FINDINGS  Pregnancy = 50 nanograms/ml Ranges for normal findings may vary among different laboratories and hospitals. You should always check with your doctor after having lab work or other tests done to discuss the meaning of your test results and whether your values are considered within normal limits. MEANING OF TEST  Your caregiver will go over the test results with you and discuss the importance and meaning of your results, as well as treatment options and the need for additional tests if necessary. OBTAINING THE TEST RESULTS  It is your responsibility to obtain your test results. Ask the lab or department performing the test when and how you will get your results. Document Released: 09/16/2004 Document Revised: 02/07/2012 Document Reviewed: 10/25/2008 [facility] Patient Information 2014 [place], [place].  Braxton Hicks Contractions Pregnancy is commonly associated with contractions of the uterus throughout the pregnancy. Towards the end of pregnancy (32 to 34 weeks), these contractions [facility](Braxton Willa RoughHicks) can develop more often and may become more forceful. This is not true labor because these contractions do not result in opening (dilatation) and thinning of the cervix. They are sometimes difficult to tell apart from true labor because these contractions can be forceful and people have different pain tolerances. You should not feel embarrassed if you go to the hospital with false labor. Sometimes, the only way to tell if you are in true labor is for your caregiver to follow the changes in the cervix. How to tell the difference between true and false labor:  False labor.  The contractions of false labor are usually shorter, irregular and not as hard as those of true labor.  They are often felt in the front of the lower abdomen and in the groin.  They may leave with walking around or changing positions while lying down.  They get weaker and are shorter lasting as  time goes on.  These contractions are usually irregular.  They do not usually become progressively stronger, regular and closer together as with true labor.  True labor.  Contractions in true labor last 30 to 70 seconds, become very regular, usually become more intense, and increase in frequency.  They do not go away with walking.  The discomfort is usually felt in the top of the uterus and spreads to the lower abdomen and low back.  True labor can be determined by your caregiver with an exam. This will show that the cervix is dilating and getting thinner. If there are no prenatal problems or other health problems associated with the pregnancy, it is completely safe to be sent home with false labor and await the onset of true labor. HOME CARE INSTRUCTIONS   Keep up with your usual exercises and instructions.  Take medications as directed.  Keep your regular prenatal appointment.  Eat and drink lightly if you think you are going into labor.  If BH contractions are making you uncomfortable:  Change your activity position from lying down or resting to walking/walking to resting.  Sit and rest in a tub of warm water.  Drink 2 to 3 glasses of water. Dehydration may cause B-H contractions.  Do slow and deep breathing several times an hour. SEEK IMMEDIATE MEDICAL CARE IF:   Your contractions continue to become stronger, more regular, and closer together.  You have a gushing, burst or leaking of fluid from the vagina.  An oral temperature above 102 F (38.9 C) develops.  You have passage of blood-tinged mucus.  You develop vaginal bleeding.  You develop continuous belly (abdominal) pain.  You have low back pain that you never had before.  You feel the baby's head pushing down causing pelvic pressure.  The baby is not moving as much as it used to. Document Released: 11/15/2005 Document Revised: 02/07/2012 Document Reviewed: 08/27/2013 [facility] Patient Information 2014  [place], [place].  Pelvic Rest Pelvic rest is sometimes recommended for women when:   The placenta is partially or completely covering the opening of the cervix (placenta previa).  There is bleeding between the uterine wall and the amniotic sac in the first trimester (subchorionic hemorrhage).  The cervix begins to open without labor starting (incompetent cervix, cervical insufficiency).  The labor is too early (preterm labor). HOME CARE INSTRUCTIONS  Do not have sexual intercourse, stimulation, or an orgasm.  Do not use tampons, douche, or put anything in the vagina.  Do not lift anything over 10 pounds (4.5 kg).  Avoid strenuous activity or straining your pelvic muscles. SEEK MEDICAL CARE IF:  You have any vaginal bleeding during pregnancy. Treat this as a potential emergency.  You have cramping pain felt low in the stomach (stronger than menstrual cramps).  You notice vaginal discharge (watery, mucus, or bloody).  You have a low, dull backache.  There are regular contractions or uterine tightening. SEEK IMMEDIATE MEDICAL CARE IF: You have vaginal bleeding and have placenta previa.  Document Released: 03/12/2011 Document Revised: 02/07/2012 Document Reviewed: 03/12/2011 [facility] Patient Information 2014 [place], [place].

## 2014-03-08 NOTE — MAU Provider Note (Signed)
History     CSN: [id number]  Arrival date and time: 03/08/14 1714   First Provider Initiated Contact with Patient 03/08/14 1744      Chief Complaint  Patient presents with  . Labor Eval   HPI  Elizabeth Villanueva is a 23 y.o. [id number] at 2123w0d who presents today with contractions. She states that she started to feel upper abdominal pain around 1500. She attributed this pain to contractions. She denies any bleeding or LOF, and states that the baby has been moving normally. She denies any nausea/vomiting/diarrhea/constipation or urinary sx. She had pre-term contractions with her last pregnancy, but delivered at full-term.   Past Medical History  Diagnosis Date  . Anemia   . Asthma   . Syncope     Past Surgical History  Procedure Laterality Date  . No past surgeries      Family History  Problem Relation Age of Onset  . Cancer Mother   . Hyperlipidemia Maternal Grandmother     History  Substance Use Topics  . Smoking status: Never Smoker   . Smokeless tobacco: Not on file  . Alcohol Use: No    Allergies:  Allergies  Allergen Reactions  . Fish Allergy Anaphylaxis    Prescriptions prior to admission  Medication Sig Dispense Refill  . albuterol (PROVENTIL HFA;VENTOLIN HFA) 108 (90 BASE) MCG/ACT inhaler Inhale 1-2 puffs into the lungs every 6 (six) hours as needed for wheezing or shortness of breath.      . Prenatal Vit-Fe Fumarate-FA (PRENATAL MULTIVITAMIN) TABS tablet Take 1 tablet by mouth daily at 12 noon.        ROS Physical Exam   Blood pressure 107/63, pulse 92, temperature 99.1 F (37.3 C), temperature source Oral, resp. rate 16, height 5\' 6"  (1.676 m), weight 69.4 kg (153 lb), last menstrual period 07/31/2013, SpO2 100.00%.  Physical Exam  Nursing note and vitals reviewed. Constitutional: She is oriented to person, place, and time. She appears well-developed and well-nourished. No distress.  Cardiovascular: Normal rate.   Respiratory: Effort normal.  GI:  Soft. There is no tenderness.  Genitourinary:   External: no lesion Vagina: small amount of white discharge Cervix: pink, smooth, 1/70/-3/soft/vtx Uterus: AGA    Neurological: She is alert and oriented to person, place, and time.  Skin: Skin is warm and dry.  Psychiatric: She has a normal mood and affect.   FHT: 140, moderate with 15x15 accels, no decels Toco: no tracing any contractions, monitor adjusted 1757: Unable to palpate any contractions  MAU Course  Procedures  Results for orders placed during the hospital encounter of 03/08/14 (from the past 24 hour(s))  URINALYSIS, ROUTINE W REFLEX MICROSCOPIC     Status: Abnormal   Collection Time    03/08/14  5:30 PM      Result Value Ref Range   Color, Urine YELLOW  YELLOW   APPearance CLEAR  CLEAR   Specific Gravity, Urine 1.015  1.005 - 1.030   pH 6.0  5.0 - 8.0   Glucose, UA 100 (*) NEGATIVE mg/dL   Hgb urine dipstick NEGATIVE  NEGATIVE   Bilirubin Urine NEGATIVE  NEGATIVE   Ketones, ur NEGATIVE  NEGATIVE mg/dL   Protein, ur NEGATIVE  NEGATIVE mg/dL   Urobilinogen, UA 0.2  0.0 - 1.0 mg/dL   Nitrite NEGATIVE  NEGATIVE   Leukocytes, UA NEGATIVE  NEGATIVE  WET PREP, GENITAL     Status: Abnormal   Collection Time    03/08/14  5:48 PM  Result Value Ref Range   Yeast Wet Prep HPF POC NONE SEEN  NONE SEEN   Trich, Wet Prep NONE SEEN  NONE SEEN   Clue Cells Wet Prep HPF POC FEW (*) NONE SEEN   WBC, Wet Prep HPF POC MODERATE (*) NONE SEEN  FETAL FIBRONECTIN     Status: Abnormal   Collection Time    03/08/14  5:48 PM      Result Value Ref Range   Fetal Fibronectin POSITIVE (*) NEGATIVE  GLUCOSE, CAPILLARY     Status: None   Collection Time    03/08/14  6:47 PM      Result Value Ref Range   Glucose-Capillary 83  70 - 99 mg/dL   Comment 1 Documented in Chart     Comment 2 Notify RN     1820: D/W Dr. Ambrose Mantle will give one dose of terb and a GI cocktail as well as BMZ today (and again in 24 hours). If pain improves can  send home for FU tomorrow here for 2nd dose of BMZ 2011: Patient reports that pain has subsided. No contractions on the monitor, Possibly some UI seen prior to terb. Cervix unchanged with more thickness to it than last time, but still soft.   Assessment and Plan   1. Labor, false (Braxton-Hicks), antepartum   2. Positive fetal fibronectin at 22 weeks to [redacted] weeks gestation    PTL precautions Fetal kick counts Return to MAU with any warning signs  Otherwise FU in 24 hours for repeat BMZ Pelvic rest until further notice  [name] 03/08/2014, 5:53 PM

## 2014-03-09 ENCOUNTER — Encounter (HOSPITAL_COMMUNITY): Payer: Self-pay | Admitting: *Deleted

## 2014-03-09 ENCOUNTER — Inpatient Hospital Stay (HOSPITAL_COMMUNITY)
Admission: AD | Admit: 2014-03-09 | Discharge: 2014-03-09 | Disposition: A | Discharge status: Home / Self Care | Payer: 59 | Source: Ambulatory Visit | Attending: Obstetrics and Gynecology | Admitting: Obstetrics and Gynecology

## 2014-03-09 DIAGNOSIS — M545 Low back pain, unspecified: Secondary | ICD-10-CM | POA: Insufficient documentation

## 2014-03-09 DIAGNOSIS — R8789 Other abnormal findings in specimens from female genital organs: Secondary | ICD-10-CM

## 2014-03-09 DIAGNOSIS — O09899 Supervision of other high risk pregnancies, unspecified trimester: Secondary | ICD-10-CM

## 2014-03-09 DIAGNOSIS — O479 False labor, unspecified: Secondary | ICD-10-CM

## 2014-03-09 DIAGNOSIS — O4700 False labor before 37 completed weeks of gestation, unspecified trimester: Secondary | ICD-10-CM | POA: Insufficient documentation

## 2014-03-09 LAB — AMNISURE RUPTURE OF MEMBRANE (ROM) NOT AT ARMC: Amnisure ROM: NEGATIVE

## 2014-03-09 MED ORDER — BETAMETHASONE SOD PHOS & ACET 6 (3-3) MG/ML IJ SUSP
12.0000 mg | Freq: Once | INTRAMUSCULAR | Status: AC
  Administered 2014-03-09: 12 mg via INTRAMUSCULAR
  Filled 2014-03-09: qty 2

## 2014-03-09 NOTE — MAU Note (Signed)
Contractions and ? Leaking fld. Was seen MAU Friday night and had pos FFN.

## 2014-03-09 NOTE — Progress Notes (Signed)
Amnisure obtained. No pooling of fld noted but a lot of thick d/c

## 2014-03-09 NOTE — Progress Notes (Signed)
Pt has scheduled appt for Tues and will keep that appt. To call for any concerns.

## 2014-03-09 NOTE — Progress Notes (Signed)
Elizabeth Rasch NP in earlier to discuss plan of care and d/c plan if amnisure negative. Written and verbal d/c instructions given and understanding voiced. To Return to MAU tonight for 2nd betamethasone inj

## 2014-03-09 NOTE — MAU Provider Note (Signed)
History     CSN: [id number]  Arrival date and time: 03/09/14 8657   First Provider Initiated Contact with Patient 03/09/14 0422      Chief Complaint  Patient presents with  . Contractions  . Rupture of Membranes   HPI  [name] is a 23 y.o. female [phone] at [redacted]w[redacted]d who presents with contractions and vaginal wetness. The patient was seen here yesterday evening with contractions. Pt has not had a gush of fluid, however feels her underwear has been wet since she left MAU. The patient was sent home and around 0000 she felt a sharp pain in her abdomen that has continued to come and go. She is also experiencing dull, lower back pain. Compared to the pain she was having yesterday while in MAU the pain is the same.  The patient is receiving 17P injections; history of PTL with first pregnancy, however delivered at term.  While in MAU 4/10 she had a positive fetal fibronectin. She received 1 dose of betamethasone and was instructed to come back 24 hrs later for 2nd dose.   OB History   Grav Para Term Preterm Abortions TAB SAB Ect Mult Living   4 1 1  2  2   1       Past Medical History  Diagnosis Date  . Anemia   . Asthma   . Syncope     Past Surgical History  Procedure Laterality Date  . No past surgeries      Family History  Problem Relation Age of Onset  . Cancer Mother   . Hyperlipidemia Maternal Grandmother     History  Substance Use Topics  . Smoking status: Never Smoker   . Smokeless tobacco: Not on file  . Alcohol Use: No    Allergies:  Allergies  Allergen Reactions  . Fish Allergy Anaphylaxis    Prescriptions prior to admission  Medication Sig Dispense Refill  . albuterol (PROVENTIL HFA;VENTOLIN HFA) 108 (90 BASE) MCG/ACT inhaler Inhale 1-2 puffs into the lungs every 6 (six) hours as needed for wheezing or shortness of breath.      . diphenhydramine-acetaminophen (TYLENOL PM) 25-500 MG TABS Take 1 tablet by mouth at bedtime as needed.      . Prenatal  Vit-Fe Fumarate-FA (PRENATAL MULTIVITAMIN) TABS tablet Take 1 tablet by mouth daily at 12 noon.      Marland Kitchen PROGESTERONE IM Inject into the muscle once a week.       Results for orders placed during the hospital encounter of 03/09/14 (from the past 48 hour(s))  AMNISURE RUPTURE OF MEMBRANE (ROM)     Status: None   Collection Time    03/09/14  4:30 AM      Result Value Ref Range   Amnisure ROM NEGATIVE      Review of Systems  Gastrointestinal: Positive for abdominal pain (+ cramping/contraction pain ).  Genitourinary:       + vaginal discharge; "feels wet" No vaginal bleeding. No dysuria.   Musculoskeletal: Positive for back pain.   Physical Exam   Blood pressure 111/69, pulse 98, temperature 98.1 F (36.7 C), resp. rate 18, last menstrual period 07/31/2013.  Physical Exam  Constitutional: She is oriented to person, place, and time. She appears well-developed and well-nourished. No distress.  HENT:  Head: Normocephalic.  Eyes: Pupils are equal, round, and reactive to light.  Neck: Neck supple.  Respiratory: Effort normal.  GI: Soft.  Genitourinary:  Speculum exam: Vagina - Small amount of thick discharge,  no odor Cervix - No contact bleeding, no active bleeding, thick, pale yellow discharge at cervix. No pooling of fluid  Chaperone present for exam.   Musculoskeletal: Normal range of motion.  Neurological: She is alert and oriented to person, place, and time.  Skin: Skin is warm. She is not diaphoretic.  Psychiatric: Her behavior is normal.     Fetal Tracing: Baseline: 125 BPM  Variability: Moderate  Accelerations: 15x15 Decelerations: None Toco: None  Dilation: 1 Effacement (%): Thick Cervical Position: Posterior Exam by:: Venia CarbonJennifer Rasch NP Exam unchanged from previous cervical check.   MAU Course  Procedures None  MDM NST Amnisure  Consulted with Dr. Ambrose MantleHenley at [phone]; Ok to discharge patient home with preterm labor contractions. Cervix is unchanged, no  contractions noted on monitor.   Assessment and Plan   A:  1. Braxton Hick's contraction   2. Positive fetal fibronectin at 22 weeks to [redacted] weeks gestation    P:  Discharge home in stable condition Preterm labor precautions discussed Return to MAU 4/11 for second dose of steroids Kick counts discussed   Elizabeth HansenJennifer Irene Rasch, NP  03/09/2014, 4:22 AM

## 2014-03-09 NOTE — Discharge Instructions (Signed)
Braxton Hicks Contractions °Pregnancy is commonly associated with contractions of the uterus throughout the pregnancy. Towards the end of pregnancy (32 to 34 weeks), these contractions (Braxton Hicks) can develop more often and may become more forceful. This is not true labor because these contractions do not result in opening (dilatation) and thinning of the cervix. They are sometimes difficult to tell apart from true labor because these contractions can be forceful and people have different pain tolerances. You should not feel embarrassed if you go to the hospital with false labor. Sometimes, the only way to tell if you are in true labor is for your caregiver to follow the changes in the cervix. °How to tell the difference between true and false labor: °· False labor. °· The contractions of false labor are usually shorter, irregular and not as hard as those of true labor. °· They are often felt in the front of the lower abdomen and in the groin. °· They may leave with walking around or changing positions while lying down. °· They get weaker and are shorter lasting as time goes on. °· These contractions are usually irregular. °· They do not usually become progressively stronger, regular and closer together as with true labor. °· True labor. °· Contractions in true labor last 30 to 70 seconds, become very regular, usually become more intense, and increase in frequency. °· They do not go away with walking. °· The discomfort is usually felt in the top of the uterus and spreads to the lower abdomen and low back. °· True labor can be determined by your caregiver with an exam. This will show that the cervix is dilating and getting thinner. °If there are no prenatal problems or other health problems associated with the pregnancy, it is completely safe to be sent home with false labor and await the onset of true labor. °HOME CARE INSTRUCTIONS  °· Keep up with your usual exercises and instructions. °· Take medications as  directed. °· Keep your regular prenatal appointment. °· Eat and drink lightly if you think you are going into labor. °· If BH contractions are making you uncomfortable: °· Change your activity position from lying down or resting to walking/walking to resting. °· Sit and rest in a tub of warm water. °· Drink 2 to 3 glasses of water. Dehydration may cause B-H contractions. °· Do slow and deep breathing several times an hour. °SEEK IMMEDIATE MEDICAL CARE IF:  °· Your contractions continue to become stronger, more regular, and closer together. °· You have a gushing, burst or leaking of fluid from the vagina. °· An oral temperature above 102° F (38.9° C) develops. °· You have passage of blood-tinged mucus. °· You develop vaginal bleeding. °· You develop continuous belly (abdominal) pain. °· You have low back pain that you never had before. °· You feel the baby's head pushing down causing pelvic pressure. °· The baby is not moving as much as it used to. °Document Released: 11/15/2005 Document Revised: 02/07/2012 Document Reviewed: 08/27/2013 °[employer]® Patient Information ©2014 [employer]. ° ° °Pelvic Rest °Pelvic rest is sometimes recommended for women when:  °· The placenta is partially or completely covering the opening of the cervix (placenta previa). °· There is bleeding between the uterine wall and the amniotic sac in the first trimester (subchorionic hemorrhage). °· The cervix begins to open without labor starting (incompetent cervix, cervical insufficiency). °· The labor is too early (preterm labor). °HOME CARE INSTRUCTIONS °· Do not have sexual intercourse, stimulation, or an orgasm. °·   Do not use tampons, douche, or put anything in the vagina. °· Do not lift anything over 10 pounds (4.5 kg). °· Avoid strenuous activity or straining your pelvic muscles. °SEEK MEDICAL CARE IF:  °· You have any vaginal bleeding during pregnancy. Treat this as a potential emergency. °· You have cramping pain felt low in the  stomach (stronger than menstrual cramps). °· You notice vaginal discharge (watery, mucus, or bloody). °· You have a low, dull backache. °· There are regular contractions or uterine tightening. °SEEK IMMEDIATE MEDICAL CARE IF: °You have vaginal bleeding and have placenta previa.  °Document Released: 03/12/2011 Document Revised: 02/07/2012 Document Reviewed: 03/12/2011 °[employer]® Patient Information ©2014 [employer]. ° °

## 2014-03-09 NOTE — MAU Note (Signed)
Contractions woke me up. I've been feeling wet since 0230.

## 2014-03-09 NOTE — Progress Notes (Signed)
Jennifer Rasch NP notified of pt's admission and status. Will see pt 

## 2014-03-09 NOTE — MAU Note (Signed)
Here for 2nd Betamethasone inj. Denies pain, leaking fld, bleeding. Good FM. Acceleration heard with FM when dopplered FHR.

## 2014-03-09 NOTE — Progress Notes (Signed)
Venia CarbonJennifer Rasch NP

## 2014-03-09 NOTE — Progress Notes (Signed)
To lobby to wait for husband to return to pick her up

## 2014-04-09 LAB — OB RESULTS CONSOLE GBS: GBS: NEGATIVE

## 2014-04-21 ENCOUNTER — Inpatient Hospital Stay (HOSPITAL_COMMUNITY)
Admission: AD | Admit: 2014-04-21 | Discharge: 2014-04-21 | Disposition: A | Discharge status: Home / Self Care | Payer: 59 | Source: Ambulatory Visit | Attending: Obstetrics and Gynecology | Admitting: Obstetrics and Gynecology

## 2014-04-21 ENCOUNTER — Encounter (HOSPITAL_COMMUNITY): Payer: Self-pay | Admitting: *Deleted

## 2014-04-21 DIAGNOSIS — O479 False labor, unspecified: Secondary | ICD-10-CM | POA: Insufficient documentation

## 2014-04-21 NOTE — MAU Note (Signed)
Pt presents to MAU with complaints of contractions that started at 1 today. Denies any vaginal bleeding or LOF.

## 2014-04-21 NOTE — Discharge Instructions (Signed)

## 2014-05-01 ENCOUNTER — Inpatient Hospital Stay (HOSPITAL_COMMUNITY)
Admission: AD | Admit: 2014-05-01 | Discharge: 2014-05-01 | Disposition: A | Discharge status: Home / Self Care | Payer: 59 | Source: Ambulatory Visit | Attending: Obstetrics and Gynecology | Admitting: Obstetrics and Gynecology

## 2014-05-01 ENCOUNTER — Encounter (HOSPITAL_COMMUNITY): Payer: Self-pay | Admitting: *Deleted

## 2014-05-01 DIAGNOSIS — O479 False labor, unspecified: Secondary | ICD-10-CM | POA: Insufficient documentation

## 2014-05-01 NOTE — MAU Note (Addendum)
Contractions started at 0130, they are 2 min apart.  Started leaking clear fuid at that time also.  Was 3 cm

## 2014-05-01 NOTE — MAU Note (Signed)
Pt reports uc'a since 1330

## 2014-05-01 NOTE — Discharge Instructions (Signed)
Braxton Hicks Contractions Pregnancy is commonly associated with contractions of the uterus throughout the pregnancy. Towards the end of pregnancy (32 to 34 weeks), these contractions (Braxton Hicks) can develop more often and may become more forceful. This is not true labor because these contractions do not result in opening (dilatation) and thinning of the cervix. They are sometimes difficult to tell apart from true labor because these contractions can be forceful and people have different pain tolerances. You should not feel embarrassed if you go to the hospital with false labor. Sometimes, the only way to tell if you are in true labor is for your caregiver to follow the changes in the cervix. How to tell the difference between true and false labor:  False labor.  The contractions of false labor are usually shorter, irregular and not as hard as those of true labor.  They are often felt in the front of the lower abdomen and in the groin.  They may leave with walking around or changing positions while lying down.  They get weaker and are shorter lasting as time goes on.  These contractions are usually irregular.  They do not usually become progressively stronger, regular and closer together as with true labor.  True labor.  Contractions in true labor last 30 to 70 seconds, become very regular, usually become more intense, and increase in frequency.  They do not go away with walking.  The discomfort is usually felt in the top of the uterus and spreads to the lower abdomen and low back.  True labor can be determined by your caregiver with an exam. This will show that the cervix is dilating and getting thinner. If there are no prenatal problems or other health problems associated with the pregnancy, it is completely safe to be sent home with false labor and await the onset of true labor. HOME CARE INSTRUCTIONS   Keep up with your usual exercises and instructions.  Take medications as  directed.  Keep your regular prenatal appointment.  Eat and drink lightly if you think you are going into labor.  If BH contractions are making you uncomfortable:  Change your activity position from lying down or resting to walking/walking to resting.  Sit and rest in a tub of warm water.  Drink 2 to 3 glasses of water. Dehydration may cause B-H contractions.  Do slow and deep breathing several times an hour. SEEK IMMEDIATE MEDICAL CARE IF:   Your contractions continue to become stronger, more regular, and closer together.  You have a gushing, burst or leaking of fluid from the vagina.  An oral temperature above 102 F (38.9 C) develops.  You have passage of blood-tinged mucus.  You develop vaginal bleeding.  You develop continuous belly (abdominal) pain.  You have low back pain that you never had before.  You feel the baby's head pushing down causing pelvic pressure.  The baby is not moving as much as it used to. Document Released: 11/15/2005 Document Revised: 02/07/2012 Document Reviewed: 08/27/2013 Exit[employer] Patient Information 2014 [employer].  Fetal Movement Counts Patient Name: __________________________________________________ Patient Due Date: ____________________ Performing a fetal movement count is highly recommended in high-risk pregnancies, but it is good for every pregnant woman to do. Your caregiver may ask you to start counting fetal movements at 28 weeks of the pregnancy. Fetal movements often increase:  After eating a full meal.  After physical activity.  After eating or drinking something sweet or cold.  At rest. Pay attention to when you feel   the baby is most active. This will help you notice a pattern of your baby's sleep and wake cycles and what factors contribute to an increase in fetal movement. It is important to perform a fetal movement count at the same time each day when your baby is normally most active.  HOW TO COUNT FETAL  MOVEMENTS 1. Find a quiet and comfortable area to sit or lie down on your left side. Lying on your left side provides the best blood and oxygen circulation to your baby. 2. Write down the day and time on a sheet of paper or in a journal. 3. Start counting kicks, flutters, swishes, rolls, or jabs in a 2 hour period. You should feel at least 10 movements within 2 hours. 4. If you do not feel 10 movements in 2 hours, wait 2 3 hours and count again. Look for a change in the pattern or not enough counts in 2 hours. SEEK MEDICAL CARE IF:  You feel less than 10 counts in 2 hours, tried twice.  There is no movement in over an hour.  The pattern is changing or taking longer each day to reach 10 counts in 2 hours.  You feel the baby is not moving as he or she usually does. Date: ____________ Movements: ____________ Start time: ____________ Finish time: ____________  Date: ____________ Movements: ____________ Start time: ____________ Finish time: ____________ Date: ____________ Movements: ____________ Start time: ____________ Finish time: ____________ Date: ____________ Movements: ____________ Start time: ____________ Finish time: ____________ Date: ____________ Movements: ____________ Start time: ____________ Finish time: ____________ Date: ____________ Movements: ____________ Start time: ____________ Finish time: ____________ Date: ____________ Movements: ____________ Start time: ____________ Finish time: ____________ Date: ____________ Movements: ____________ Start time: ____________ Finish time: ____________  Date: ____________ Movements: ____________ Start time: ____________ Finish time: ____________ Date: ____________ Movements: ____________ Start time: ____________ Finish time: ____________ Date: ____________ Movements: ____________ Start time: ____________ Finish time: ____________ Date: ____________ Movements: ____________ Start time: ____________ Finish time: ____________ Date: ____________  Movements: ____________ Start time: ____________ Finish time: ____________ Date: ____________ Movements: ____________ Start time: ____________ Finish time: ____________ Date: ____________ Movements: ____________ Start time: ____________ Finish time: ____________  Date: ____________ Movements: ____________ Start time: ____________ Finish time: ____________ Date: ____________ Movements: ____________ Start time: ____________ Finish time: ____________ Date: ____________ Movements: ____________ Start time: ____________ Finish time: ____________ Date: ____________ Movements: ____________ Start time: ____________ Finish time: ____________ Date: ____________ Movements: ____________ Start time: ____________ Finish time: ____________ Date: ____________ Movements: ____________ Start time: ____________ Finish time: ____________ Date: ____________ Movements: ____________ Start time: ____________ Finish time: ____________  Date: ____________ Movements: ____________ Start time: ____________ Finish time: ____________ Date: ____________ Movements: ____________ Start time: ____________ Finish time: ____________ Date: ____________ Movements: ____________ Start time: ____________ Finish time: ____________ Date: ____________ Movements: ____________ Start time: ____________ Finish time: ____________ Date: ____________ Movements: ____________ Start time: ____________ Finish time: ____________ Date: ____________ Movements: ____________ Start time: ____________ Finish time: ____________ Date: ____________ Movements: ____________ Start time: ____________ Finish time: ____________  Date: ____________ Movements: ____________ Start time: ____________ Finish time: ____________ Date: ____________ Movements: ____________ Start time: ____________ Finish time: ____________ Date: ____________ Movements: ____________ Start time: ____________ Finish time: ____________ Date: ____________ Movements: ____________ Start time:  ____________ Finish time: ____________ Date: ____________ Movements: ____________ Start time: ____________ Finish time: ____________ Date: ____________ Movements: ____________ Start time: ____________ Finish time: ____________ Date: ____________ Movements: ____________ Start time: ____________ Finish time: ____________  Date: ____________ Movements: ____________ Start time: ____________ Finish time: ____________ Date: ____________ Movements: ____________ Start   time: ____________ Finish time: ____________ Date: ____________ Movements: ____________ Start time: ____________ Finish time: ____________ Date: ____________ Movements: ____________ Start time: ____________ Finish time: ____________ Date: ____________ Movements: ____________ Start time: ____________ Finish time: ____________ Date: ____________ Movements: ____________ Start time: ____________ Finish time: ____________ Date: ____________ Movements: ____________ Start time: ____________ Finish time: ____________  Date: ____________ Movements: ____________ Start time: ____________ Finish time: ____________ Date: ____________ Movements: ____________ Start time: ____________ Finish time: ____________ Date: ____________ Movements: ____________ Start time: ____________ Finish time: ____________ Date: ____________ Movements: ____________ Start time: ____________ Finish time: ____________ Date: ____________ Movements: ____________ Start time: ____________ Finish time: ____________ Date: ____________ Movements: ____________ Start time: ____________ Finish time: ____________ Date: ____________ Movements: ____________ Start time: ____________ Finish time: ____________  Date: ____________ Movements: ____________ Start time: ____________ Finish time: ____________ Date: ____________ Movements: ____________ Start time: ____________ Finish time: ____________ Date: ____________ Movements: ____________ Start time: ____________ Finish time: ____________ Date:  ____________ Movements: ____________ Start time: ____________ Finish time: ____________ Date: ____________ Movements: ____________ Start time: ____________ Finish time: ____________ Date: ____________ Movements: ____________ Start time: ____________ Finish time: ____________ Document Released: 12/15/2006 Document Revised: 11/01/2012 Document Reviewed: 09/11/2012 [employer] Patient Information 2014 [employer].  

## 2014-05-02 ENCOUNTER — Encounter (HOSPITAL_COMMUNITY): Payer: Self-pay | Admitting: *Deleted

## 2014-05-02 ENCOUNTER — Telehealth (HOSPITAL_COMMUNITY): Payer: Self-pay | Admitting: *Deleted

## 2014-05-02 NOTE — Telephone Encounter (Signed)
Preadmission screen  

## 2014-05-05 NOTE — H&P (Signed)
Elizabeth Villanueva is a 23 y.o. female G4P1011 at 39+weeks (EDD6/12/15 by LMP c/w 9 week [place]) presenting for IOL at term.  Prenatal care complicated by h/o PTL last pregnancy requiring bedrest and tocolytcs, ultimately delivered at 38 weeks, but was 5cm dilated prior to labor.  Offered and received 17-P this pregnancy weeks 15-36.  FOB carries sickle cell trait, but pt has Hgb AA.  On her fetal US there was some early renal pyelectasis, but it resolved on f/u scan.    Maternal Medical History:  Contractions: Frequency: irregular.   Perceived severity is mild.    Fetal activity: Perceived fetal activity is normal.    Prenatal complications: Preterm labor.   Prenatal Complications - Diabetes: none.    OB History   Grav Para Term Preterm Abortions TAB SAB Ect Mult Living   4 1 1  2  1 1  1     2011 Ectopic with MTX 2011 NSVD 6#14oz  H/o PTL   Past Medical History  Diagnosis Date  . Anemia   . Asthma   . Syncope   . History of PCOS    Past Surgical History  Procedure Laterality Date  . No past surgeries     Family History: family history includes Cancer in her maternal grandmother and mother; Hyperlipidemia in her maternal grandmother. Social History:  reports that she has never smoked. She does not have any smokeless tobacco history on file. She reports that she does not drink alcohol or use illicit drugs.   Prenatal Transfer Tool  Maternal Diabetes: No Genetic Screening: Normal Maternal Ultrasounds/Referrals: Normal Fetal Ultrasounds or other Referrals:  None Maternal Substance Abuse:  No Significant Maternal Medications:  Meds include: Other: 17-progesterone Significant Maternal Lab Results:  None Other Comments:  None  ROS    Last menstrual period 07/31/2013. Maternal Exam:  Uterine Assessment: Contraction strength is mild.  Contraction frequency is irregular.   Abdomen: Patient reports no abdominal tenderness. Fetal presentation: vertex  Introitus: Normal vulva. Normal  vagina.    Physical Exam  Constitutional: She is oriented to person, place, and time. She appears well-developed and well-nourished.  Cardiovascular: Normal rate and regular rhythm.   Respiratory: Effort normal.  GI: Soft.  Genitourinary: Vagina normal and uterus normal.  Neurological: She is alert and oriented to person, place, and time.  Psychiatric: She has a normal mood and affect.    Prenatal labs: ABO, Rh: O/Positive/-- (11/18 0000) Antibody: Negative (11/18 0000) Rubella: Immune (11/18 0000) RPR: Nonreactive (11/18 0000)  HBsAg: Negative (11/18 0000)  HIV: Non-reactive (11/18 0000)  GBS: Negative (05/12 0000)  First trimester screen and AFP WNL One hour GTT 78 Hgb AA   Assessment/Plan: Pt for IOL at term with favorable cervix.  Plan pitocin and then AROM   Oliver Pila 05/05/2014, 8:43 AM

## 2014-05-06 ENCOUNTER — Encounter (HOSPITAL_COMMUNITY): Payer: 59 | Admitting: Anesthesiology

## 2014-05-06 ENCOUNTER — Encounter (HOSPITAL_COMMUNITY): Payer: Self-pay

## 2014-05-06 ENCOUNTER — Inpatient Hospital Stay (HOSPITAL_COMMUNITY): Payer: 59 | Admitting: Anesthesiology

## 2014-05-06 ENCOUNTER — Inpatient Hospital Stay (HOSPITAL_COMMUNITY)
Admission: RE | Admit: 2014-05-06 | Discharge: 2014-05-08 | DRG: 775 | Disposition: A | Discharge status: Home / Self Care | Payer: 59 | Source: Ambulatory Visit | Attending: Obstetrics and Gynecology | Admitting: Obstetrics and Gynecology

## 2014-05-06 VITALS — BP 109/71 | HR 58 | Temp 98.2°F | Resp 18 | Ht 66.0 in | Wt 162.0 lb

## 2014-05-06 DIAGNOSIS — D649 Anemia, unspecified: Secondary | ICD-10-CM | POA: Diagnosis present

## 2014-05-06 DIAGNOSIS — J45909 Unspecified asthma, uncomplicated: Secondary | ICD-10-CM | POA: Diagnosis present

## 2014-05-06 DIAGNOSIS — O99892 Other specified diseases and conditions complicating childbirth: Secondary | ICD-10-CM | POA: Diagnosis present

## 2014-05-06 DIAGNOSIS — O9902 Anemia complicating childbirth: Secondary | ICD-10-CM | POA: Diagnosis present

## 2014-05-06 DIAGNOSIS — Z3490 Encounter for supervision of normal pregnancy, unspecified, unspecified trimester: Secondary | ICD-10-CM

## 2014-05-06 DIAGNOSIS — O9989 Other specified diseases and conditions complicating pregnancy, childbirth and the puerperium: Secondary | ICD-10-CM

## 2014-05-06 LAB — CBC
HCT: 32.8 % — ABNORMAL LOW (ref 36.0–46.0)
HEMOGLOBIN: 11.2 g/dL — AB (ref 12.0–15.0)
MCH: 30.7 pg (ref 26.0–34.0)
MCHC: 34.1 g/dL (ref 30.0–36.0)
MCV: 89.9 fL (ref 78.0–100.0)
Platelets: 159 10*3/uL (ref 150–400)
RBC: 3.65 MIL/uL — AB (ref 3.87–5.11)
RDW: 13.6 % (ref 11.5–15.5)
WBC: 9 10*3/uL (ref 4.0–10.5)

## 2014-05-06 LAB — TYPE AND SCREEN
ABO/RH(D): O POS
ANTIBODY SCREEN: NEGATIVE

## 2014-05-06 LAB — RPR

## 2014-05-06 MED ORDER — LIDOCAINE HCL (PF) 1 % IJ SOLN
INTRAMUSCULAR | Status: DC | PRN
  Administered 2014-05-06 (×2): 4 mL

## 2014-05-06 MED ORDER — LIDOCAINE HCL (PF) 1 % IJ SOLN
30.0000 mL | INTRAMUSCULAR | Status: DC | PRN
  Filled 2014-05-06: qty 30

## 2014-05-06 MED ORDER — FENTANYL 2.5 MCG/ML BUPIVACAINE 1/10 % EPIDURAL INFUSION (WH - ANES): 14.0000 mL/h | INTRAMUSCULAR | Status: DC | PRN

## 2014-05-06 MED ORDER — SENNOSIDES-DOCUSATE SODIUM 8.6-50 MG PO TABS
2.0000 | ORAL_TABLET | ORAL | Status: DC
  Administered 2014-05-06 – 2014-05-07 (×2): 2 via ORAL
  Filled 2014-05-06 (×2): qty 2

## 2014-05-06 MED ORDER — CITRIC ACID-SODIUM CITRATE 334-500 MG/5ML PO SOLN: 30.0000 mL | ORAL | Status: DC | PRN

## 2014-05-06 MED ORDER — PHENYLEPHRINE 40 MCG/ML (10ML) SYRINGE FOR IV PUSH (FOR BLOOD PRESSURE SUPPORT)
PREFILLED_SYRINGE | INTRAVENOUS | Status: AC
  Filled 2014-05-06: qty 10

## 2014-05-06 MED ORDER — PHENYLEPHRINE 40 MCG/ML (10ML) SYRINGE FOR IV PUSH (FOR BLOOD PRESSURE SUPPORT)
80.0000 ug | PREFILLED_SYRINGE | INTRAVENOUS | Status: DC | PRN
  Filled 2014-05-06: qty 2

## 2014-05-06 MED ORDER — SIMETHICONE 80 MG PO CHEW: 80.0000 mg | CHEWABLE_TABLET | ORAL | Status: DC | PRN

## 2014-05-06 MED ORDER — ONDANSETRON HCL 4 MG/2ML IJ SOLN
4.0000 mg | Freq: Four times a day (QID) | INTRAMUSCULAR | Status: DC | PRN
  Administered 2014-05-06: 4 mg via INTRAVENOUS
  Filled 2014-05-06: qty 2

## 2014-05-06 MED ORDER — OXYTOCIN BOLUS FROM INFUSION: 500.0000 mL | INTRAVENOUS | Status: DC

## 2014-05-06 MED ORDER — OXYTOCIN 40 UNITS IN LACTATED RINGERS INFUSION - SIMPLE MED
1.0000 m[IU]/min | INTRAVENOUS | Status: DC
  Administered 2014-05-06: 2 m[IU]/min via INTRAVENOUS
  Administered 2014-05-06: 10 m[IU]/min via INTRAVENOUS
  Filled 2014-05-06: qty 1000

## 2014-05-06 MED ORDER — ONDANSETRON HCL 4 MG/2ML IJ SOLN
4.0000 mg | INTRAMUSCULAR | Status: DC | PRN
Start: 2014-05-06 — End: 2014-05-08

## 2014-05-06 MED ORDER — EPHEDRINE 5 MG/ML INJ
10.0000 mg | INTRAVENOUS | Status: DC | PRN
  Filled 2014-05-06: qty 2

## 2014-05-06 MED ORDER — FENTANYL 2.5 MCG/ML BUPIVACAINE 1/10 % EPIDURAL INFUSION (WH - ANES)
INTRAMUSCULAR | Status: AC
  Filled 2014-05-06: qty 125

## 2014-05-06 MED ORDER — DIBUCAINE 1 % RE OINT: 1.0000 "application " | TOPICAL_OINTMENT | RECTAL | Status: DC | PRN

## 2014-05-06 MED ORDER — BENZOCAINE-MENTHOL 20-0.5 % EX AERO
1.0000 "application " | INHALATION_SPRAY | CUTANEOUS | Status: DC | PRN
  Administered 2014-05-06: 1 via TOPICAL
  Filled 2014-05-06: qty 56

## 2014-05-06 MED ORDER — FENTANYL 2.5 MCG/ML BUPIVACAINE 1/10 % EPIDURAL INFUSION (WH - ANES)
INTRAMUSCULAR | Status: DC | PRN
  Administered 2014-05-06: 14 mL/h via EPIDURAL

## 2014-05-06 MED ORDER — OXYTOCIN 40 UNITS IN LACTATED RINGERS INFUSION - SIMPLE MED: 62.5000 mL/h | INTRAVENOUS | Status: DC

## 2014-05-06 MED ORDER — ONDANSETRON HCL 4 MG PO TABS
4.0000 mg | ORAL_TABLET | ORAL | Status: DC | PRN
  Administered 2014-05-07: 4 mg via ORAL
  Filled 2014-05-06: qty 1

## 2014-05-06 MED ORDER — ZOLPIDEM TARTRATE 5 MG PO TABS: 5.0000 mg | ORAL_TABLET | Freq: Every evening | ORAL | Status: DC | PRN

## 2014-05-06 MED ORDER — BUTORPHANOL TARTRATE 1 MG/ML IJ SOLN: 1.0000 mg | INTRAMUSCULAR | Status: DC | PRN

## 2014-05-06 MED ORDER — LACTATED RINGERS IV SOLN: 500.0000 mL | INTRAVENOUS | Status: DC | PRN

## 2014-05-06 MED ORDER — TETANUS-DIPHTH-ACELL PERTUSSIS 5-2.5-18.5 LF-MCG/0.5 IM SUSP: 0.5000 mL | Freq: Once | INTRAMUSCULAR | Status: DC

## 2014-05-06 MED ORDER — OXYCODONE-ACETAMINOPHEN 5-325 MG PO TABS
1.0000 | ORAL_TABLET | ORAL | Status: DC | PRN
  Administered 2014-05-06: 1 via ORAL
  Administered 2014-05-07 (×2): 2 via ORAL
  Administered 2014-05-07: 1 via ORAL
  Filled 2014-05-06: qty 2
  Filled 2014-05-06: qty 1
  Filled 2014-05-06: qty 2
  Filled 2014-05-06: qty 1

## 2014-05-06 MED ORDER — TERBUTALINE SULFATE 1 MG/ML IJ SOLN: 0.2500 mg | Freq: Once | INTRAMUSCULAR | Status: DC | PRN

## 2014-05-06 MED ORDER — DIPHENHYDRAMINE HCL 50 MG/ML IJ SOLN: 12.5000 mg | INTRAMUSCULAR | Status: DC | PRN

## 2014-05-06 MED ORDER — IBUPROFEN 600 MG PO TABS
600.0000 mg | ORAL_TABLET | Freq: Four times a day (QID) | ORAL | Status: DC
  Administered 2014-05-06 – 2014-05-08 (×8): 600 mg via ORAL
  Filled 2014-05-06 (×8): qty 1

## 2014-05-06 MED ORDER — LACTATED RINGERS IV SOLN
500.0000 mL | Freq: Once | INTRAVENOUS | Status: DC
Start: 2014-05-06 — End: 2014-05-06

## 2014-05-06 MED ORDER — EPHEDRINE 5 MG/ML INJ
INTRAVENOUS | Status: AC
  Filled 2014-05-06: qty 4

## 2014-05-06 MED ORDER — IBUPROFEN 600 MG PO TABS: 600.0000 mg | ORAL_TABLET | Freq: Four times a day (QID) | ORAL | Status: DC | PRN

## 2014-05-06 MED ORDER — ALBUTEROL SULFATE (2.5 MG/3ML) 0.083% IN NEBU: INHALATION_SOLUTION | Freq: Four times a day (QID) | RESPIRATORY_TRACT | Status: DC | PRN

## 2014-05-06 MED ORDER — LACTATED RINGERS IV SOLN: INTRAVENOUS | Status: DC

## 2014-05-06 MED ORDER — OXYCODONE-ACETAMINOPHEN 5-325 MG PO TABS: 1.0000 | ORAL_TABLET | ORAL | Status: DC | PRN

## 2014-05-06 MED ORDER — WITCH HAZEL-GLYCERIN EX PADS
1.0000 "application " | MEDICATED_PAD | CUTANEOUS | Status: DC | PRN
  Administered 2014-05-07: 1 via TOPICAL

## 2014-05-06 MED ORDER — ACETAMINOPHEN 325 MG PO TABS
650.0000 mg | ORAL_TABLET | ORAL | Status: DC | PRN
Start: 2014-05-06 — End: 2014-05-06

## 2014-05-06 MED ORDER — LANOLIN HYDROUS EX OINT: TOPICAL_OINTMENT | CUTANEOUS | Status: DC | PRN

## 2014-05-06 MED ORDER — DIPHENHYDRAMINE HCL 25 MG PO CAPS: 25.0000 mg | ORAL_CAPSULE | Freq: Four times a day (QID) | ORAL | Status: DC | PRN

## 2014-05-06 NOTE — Anesthesia Preprocedure Evaluation (Signed)
Anesthesia Evaluation  Patient identified by MRN, date of birth, ID band Patient awake    Reviewed: Allergy & Precautions, H&P , NPO status , Patient's Chart, lab work & pertinent test results  Airway Mallampati: II TM Distance: >3 FB Neck ROM: Full    Dental no notable dental hx. (+) Teeth Intact   Pulmonary asthma ,  breath sounds clear to auscultation  Pulmonary exam normal       Cardiovascular negative cardio ROS  Rhythm:Regular Rate:Normal     Neuro/Psych negative neurological ROS  negative psych ROS   GI/Hepatic negative GI ROS, Neg liver ROS,   Endo/Other  negative endocrine ROS  Renal/GU negative Renal ROS  negative genitourinary   Musculoskeletal negative musculoskeletal ROS (+)   Abdominal   Peds  Hematology  (+) anemia ,   Anesthesia Other Findings   Reproductive/Obstetrics (+) Pregnancy                           Anesthesia Physical Anesthesia Plan  ASA: II  Anesthesia Plan: Epidural   Post-op Pain Management:    Induction:   Airway Management Planned: Natural Airway  Additional Equipment:   Intra-op Plan:   Post-operative Plan:   Informed Consent: I have reviewed the patients History and Physical, chart, labs and discussed the procedure including the risks, benefits and alternatives for the proposed anesthesia with the patient or authorized representative who has indicated his/her understanding and acceptance.     Plan Discussed with: Anesthesiologist  Anesthesia Plan Comments:         Anesthesia Quick Evaluation

## 2014-05-06 NOTE — Lactation Note (Signed)
This note was copied from the chart of Elizabeth Villanueva. Lactation Consultation Note  Patient Name: Elizabeth Villanueva Date: 05/06/2014 Reason for consult: Initial assessment of this second-time mother and her newborn at 5 hours postpartum.  Friends are visiting and FOB holding baby but mom reports that her new baby has been latching well, "much better than her first". Mom only breastfed for 2 weeks with her first child, now 71 1/24 yo. and stopped due to persistent nipple pain and bleeding.  Mom was shown hand expression by her nurse at recent feeding (documented by RN, Deven at [phone]) and mom says she is able to express colostrum.  LC discussed benefits of STS and encouraged frequent STS and cue feedings. Mom encouraged to feed baby 8-12 times/24 hours and with feeding cues. LC encouraged review of Baby and Me pp 9, 14 and 20-25 for STS and BF information. LC provided [employer] brochure and reviewed [facility] services and list of community and web site resources.    Maternal Data Formula Feeding for Exclusion: No Infant to breast within first hour of birth: No Breastfeeding delayed due to:: Other (comment) (mom declined) Has patient been taught Hand Expression?: Yes (mom states that she knows how to hand express colostrum) Does the patient have breastfeeding experience prior to this delivery?: Yes  Feeding Feeding Type: Breast Fed Length of feed: 20 min  LATCH Score/Interventions Latch: Grasps breast easily, tongue down, lips flanged, rhythmical sucking.  Audible Swallowing: None Intervention(s): Skin to skin;Hand expression  Type of Nipple: Everted at rest and after stimulation  Comfort (Breast/Nipple): Soft / non-tender     Hold (Positioning): Assistance needed to correctly position infant at breast and maintain latch.  LATCH Score: 7 (most recent feeding assessment by RN about an hour ago)  Lactation Tools Discussed/Used   STS, hand expression, cue feedings  Consult Status Consult  Status: Follow-up Date: 05/07/14 Follow-up type: In-patient    Zara Chess 05/06/2014, 5:33 PM

## 2014-05-06 NOTE — Anesthesia Procedure Notes (Signed)
Epidural Patient location during procedure: OB Start time: 05/06/2014 9:43 AM  Staffing Anesthesiologist: Carsen Machi A. Performed by: anesthesiologist   Preanesthetic Checklist Completed: patient identified, site marked, surgical consent, pre-op evaluation, timeout performed, IV checked, risks and benefits discussed and monitors and equipment checked  Epidural Patient position: sitting Prep: site prepped and draped and DuraPrep Patient monitoring: continuous pulse ox and blood pressure Approach: midline Location: L3-L4 Injection technique: LOR air  Needle:  Needle type: Tuohy  Needle gauge: 17 G Needle length: 9 cm and 9 Needle insertion depth: 4 cm Catheter type: closed end flexible Catheter size: 19 Gauge Catheter at skin depth: 9 cm Test dose: negative and Other  Assessment Events: blood not aspirated, injection not painful, no injection resistance, negative IV test and no paresthesia  Additional Notes Patient identified. Risks and benefits discussed including failed block, incomplete  Pain control, post dural puncture headache, nerve damage, paralysis, blood pressure Changes, nausea, vomiting, reactions to medications-both toxic and allergic and post Partum back pain. All questions were answered. Patient expressed understanding and wished to proceed. Sterile technique was used throughout procedure. Epidural site was Dressed with sterile barrier dressing. No paresthesias, signs of intravascular injection Or signs of intrathecal spread were encountered.  Patient was more comfortable after the epidural was dosed. Please see RN's note for documentation of vital signs and FHR which are stable.

## 2014-05-06 NOTE — Progress Notes (Signed)
Patient ID: Elizabeth Villanueva, female   DOB: 1991-11-17, 23 y.o.   MRN: [id number] Pt feeling some contractions already afeb vss FHR category 1 Cervix 80/4/-1 AROM clear On pitocin, follow progress, epidural prn

## 2014-05-07 LAB — CBC
HEMATOCRIT: 28.5 % — AB (ref 36.0–46.0)
HEMOGLOBIN: 9.6 g/dL — AB (ref 12.0–15.0)
MCH: 30.4 pg (ref 26.0–34.0)
MCHC: 33.7 g/dL (ref 30.0–36.0)
MCV: 90.2 fL (ref 78.0–100.0)
Platelets: 150 10*3/uL (ref 150–400)
RBC: 3.16 MIL/uL — ABNORMAL LOW (ref 3.87–5.11)
RDW: 13.7 % (ref 11.5–15.5)
WBC: 11.1 10*3/uL — ABNORMAL HIGH (ref 4.0–10.5)

## 2014-05-07 NOTE — Lactation Note (Signed)
This note was copied from the chart of Elizabeth Ruey Petrosian. Lactation Consultation Note  Patient Name: Elizabeth Villanueva [id number] Date: 05/07/2014 Reason for consult: Follow-up assessment of this breastfeeding dyad at 34 hours post-delivery.  RN, carrie assisted with latch and baby has been nursing for 20 minutes and falling asleep.  LATCh score=9 but few swallows noted although baby does respond to chin stimulation and breast compression and shows some strong sucking bursts.  LC encouraged these techniques as needed during feedings if baby sleepy and continue cue feedings.  Mom denies any nipple pain with feeding and baby's latch is wide with flanged lips.   Maternal Data    Feeding Feeding Type: Breast Fed Length of feed: 20 min  LATCH Score/Interventions Latch: Grasps breast easily, tongue down, lips flanged, rhythmical sucking.  Audible Swallowing: A few with stimulation Intervention(s): Hand expression Intervention(s): Alternate breast massage;Hand expression  Type of Nipple: Everted at rest and after stimulation  Comfort (Breast/Nipple): Soft / non-tender     Hold (Positioning): No assistance needed to correctly position infant at breast.  LATCH Score: 9 (per RN assessment prior to [facility] arrival)  Lactation Tools Discussed/Used   Cue feedings Stimulation techniques  Consult Status Consult Status: Follow-up Date: 05/08/14 Follow-up type: In-patient    Elizabeth Villanueva 05/07/2014, 10:18 PM

## 2014-05-07 NOTE — Progress Notes (Signed)
Post Partum Day 1  Subjective: no complaints and tolerating PO  Objective: Blood pressure 103/58, pulse 62, temperature 97.6 F (36.4 C), temperature source Oral, resp. rate 18, height 5\' 6"  (1.676 m), weight 73.483 kg (162 lb), last menstrual period 07/31/2013, unknown if currently breastfeeding.  Physical Exam:  General: alert and cooperative Lochia: appropriate Uterine Fundus: firm     Recent Labs  05/06/14 0635 05/07/14 0600  HGB 11.2* 9.6*  HCT 32.8* 28.5*    Assessment/Plan: Plan for discharge tomorrow   LOS: 1 day   Elizabeth Villanueva 05/07/2014, 6:55 AM

## 2014-05-07 NOTE — Anesthesia Postprocedure Evaluation (Signed)
  Anesthesia Post-op Note  Patient: Elizabeth Villanueva  Procedure(s) Performed: * No procedures listed *  Patient Location: Mother/Baby  Anesthesia Type:Epidural  Level of Consciousness: awake, alert  and oriented  Airway and Oxygen Therapy: Patient Spontanous Breathing  Post-op Pain: none  Post-op Assessment: Post-op Vital signs reviewed and Patient's Cardiovascular Status Stable  Post-op Vital Signs: Reviewed and stable  Last Vitals:  Filed Vitals:   05/07/14 0532  BP: 103/58  Pulse: 62  Temp: 36.4 C  Resp: 18    Complications: No apparent anesthesia complications

## 2014-05-08 MED ORDER — IBUPROFEN 600 MG PO TABS: 600.0000 mg | ORAL_TABLET | Freq: Four times a day (QID) | ORAL | Status: DC

## 2014-05-08 NOTE — Progress Notes (Signed)
Post Partum Day 2 Subjective: no complaints and tolerating PO  Objective: Blood pressure 109/71, pulse 58, temperature 98.2 F (36.8 C), temperature source Oral, resp. rate 18, height 5\' 6"  (1.676 m), weight 73.483 kg (162 lb), last menstrual period 07/31/2013, SpO2 98.00%, unknown if currently breastfeeding.  Physical Exam:  General: alert and cooperative Lochia: appropriate Uterine Fundus: firm   Recent Labs  05/06/14 0635 05/07/14 0600  HGB 11.2* 9.6*  HCT 32.8* 28.5*    Assessment/Plan: Discharge home   LOS: 2 days   Rashi Granier W 05/08/2014, 9:00 AM

## 2014-05-08 NOTE — Discharge Summary (Signed)
Obstetric Discharge Summary Reason for Admission: induction of labor Prenatal Procedures: none Intrapartum Procedures: spontaneous vaginal delivery Postpartum Procedures: none Complications-Operative and Postpartum:  none Hemoglobin  Date Value Ref Range Status  05/07/2014 9.6* 12.0 - 15.0 g/dL Final     HCT  Date Value Ref Range Status  05/07/2014 28.5* 36.0 - 46.0 % Final    Physical Exam:  General: alert and cooperative Lochia: appropriate Uterine Fundus: firm  Discharge Diagnoses: Term Pregnancy-delivered  Discharge Information: Date: 05/08/2014 Activity: pelvic rest Diet: routine Medications: Ibuprofen and Percocet Condition: improved Instructions: refer to practice specific booklet Discharge to: home Follow-up Information   Follow up with Oliver Pila, MD. Schedule an appointment as soon as possible for a visit in 6 weeks. (postpartum)    Specialty:  Obstetrics and Gynecology   Contact information:   [address] [place] [address] [phone]       Newborn Data: Live born female  Birth Weight: 7 lb 14 oz (3572 g) APGAR: 9, 9  Home with mother.  Oliver Pila 05/08/2014, 9:02 AM

## 2014-05-08 NOTE — Lactation Note (Signed)
This note was copied from the chart of Elizabeth Sharda Bumgarner. Lactation Consultation Note: Mom reports this baby is nursing much better than her first. Last fed about 1 hour ago. Mom using pacifier- encouraged to nurse baby if he is hungry. Mom states he is just sucking- Encouraged to fed whenever she sees feeding cues. Mom reports that breasts are feeling fuller this morning and get softer after he nurses. Reviewed engorgement prevention and treatment. No questions at present. To call prn.  Patient Name: Elizabeth Villanueva [id number] Date: 05/08/2014 Reason for consult: Follow-up assessment   Maternal Data Formula Feeding for Exclusion: No Infant to breast within first hour of birth: No Does the patient have breastfeeding experience prior to this delivery?: Yes  Feeding    LATCH Score/Interventions                      Lactation Tools Discussed/Used     Consult Status Consult Status: Complete    Pamelia Hoit 05/08/2014, 10:12 AM

## 2014-09-30 ENCOUNTER — Encounter (HOSPITAL_COMMUNITY): Payer: Self-pay

## 2014-11-10 ENCOUNTER — Emergency Department (HOSPITAL_COMMUNITY)
Admission: EM | Admit: 2014-11-10 | Discharge: 2014-11-10 | Disposition: A | Discharge status: Home / Self Care | Payer: 59

## 2014-11-10 NOTE — ED Notes (Signed)
Pt was here with a pediatric pt which has been discharged, have called pt multiple times but no answer in lobby

## 2016-03-24 ENCOUNTER — Inpatient Hospital Stay (HOSPITAL_COMMUNITY)
Admission: AD | Admit: 2016-03-24 | Discharge: 2016-03-24 | Disposition: A | Discharge status: Home / Self Care | Payer: Medicaid Other | Source: Ambulatory Visit | Attending: Obstetrics and Gynecology | Admitting: Obstetrics and Gynecology

## 2016-03-24 ENCOUNTER — Encounter (HOSPITAL_COMMUNITY): Payer: Self-pay | Admitting: *Deleted

## 2016-03-24 DIAGNOSIS — O210 Mild hyperemesis gravidarum: Secondary | ICD-10-CM | POA: Insufficient documentation

## 2016-03-24 DIAGNOSIS — O219 Vomiting of pregnancy, unspecified: Secondary | ICD-10-CM

## 2016-03-24 DIAGNOSIS — E282 Polycystic ovarian syndrome: Secondary | ICD-10-CM | POA: Insufficient documentation

## 2016-03-24 DIAGNOSIS — Z91013 Allergy to seafood: Secondary | ICD-10-CM | POA: Diagnosis not present

## 2016-03-24 DIAGNOSIS — J45909 Unspecified asthma, uncomplicated: Secondary | ICD-10-CM | POA: Insufficient documentation

## 2016-03-24 DIAGNOSIS — Z3A09 9 weeks gestation of pregnancy: Secondary | ICD-10-CM | POA: Insufficient documentation

## 2016-03-24 LAB — COMPREHENSIVE METABOLIC PANEL
ALT: 9 U/L — ABNORMAL LOW (ref 14–54)
ANION GAP: 5 (ref 5–15)
AST: 18 U/L (ref 15–41)
Albumin: 4.4 g/dL (ref 3.5–5.0)
Alkaline Phosphatase: 43 U/L (ref 38–126)
BILIRUBIN TOTAL: 0.6 mg/dL (ref 0.3–1.2)
BUN: 8 mg/dL (ref 6–20)
CALCIUM: 9.4 mg/dL (ref 8.9–10.3)
CO2: 24 mmol/L (ref 22–32)
Chloride: 107 mmol/L (ref 101–111)
Creatinine, Ser: 0.4 mg/dL — ABNORMAL LOW (ref 0.44–1.00)
GFR calc Af Amer: >60 mL/min (ref >60)
GFR calc non Af Amer: >60 mL/min (ref >60)
Glucose, Bld: 86 mg/dL (ref 65–99)
POTASSIUM: 3.9 mmol/L (ref 3.5–5.1)
Sodium: 136 mmol/L (ref 135–145)
TOTAL PROTEIN: 7.3 g/dL (ref 6.5–8.1)

## 2016-03-24 LAB — CBC
HEMATOCRIT: 36 % (ref 36.0–46.0)
Hemoglobin: 12.5 g/dL (ref 12.0–15.0)
MCH: 30.2 pg (ref 26.0–34.0)
MCHC: 34.7 g/dL (ref 30.0–36.0)
MCV: 87 fL (ref 78.0–100.0)
Platelets: 198 10*3/uL (ref 150–400)
RBC: 4.14 MIL/uL (ref 3.87–5.11)
RDW: 13.4 % (ref 11.5–15.5)
WBC: 6.2 10*3/uL (ref 4.0–10.5)

## 2016-03-24 LAB — POCT PREGNANCY, URINE: PREG TEST UR: POSITIVE — AB

## 2016-03-24 LAB — URINALYSIS, ROUTINE W REFLEX MICROSCOPIC
Bilirubin Urine: NEGATIVE
Glucose, UA: NEGATIVE mg/dL
Hgb urine dipstick: NEGATIVE
KETONES UR: 15 mg/dL — AB
LEUKOCYTES UA: NEGATIVE
NITRITE: NEGATIVE
Protein, ur: 30 mg/dL — AB
Specific Gravity, Urine: 1.025 (ref 1.005–1.030)
pH: 6 (ref 5.0–8.0)

## 2016-03-24 LAB — URINE MICROSCOPIC-ADD ON: RBC / HPF: NONE SEEN RBC/hpf (ref 0–5)

## 2016-03-24 MED ORDER — ONDANSETRON 8 MG PO TBDP: 8.0000 mg | ORAL_TABLET | Freq: Three times a day (TID) | ORAL | Status: AC | PRN

## 2016-03-24 MED ORDER — PROMETHAZINE HCL 25 MG PO TABS: 25.0000 mg | ORAL_TABLET | Freq: Four times a day (QID) | ORAL | Status: AC | PRN

## 2016-03-24 MED ORDER — PROMETHAZINE HCL 25 MG/ML IJ SOLN
25.0000 mg | Freq: Once | INTRAVENOUS | Status: AC
  Administered 2016-03-24: 25 mg via INTRAVENOUS
  Filled 2016-03-24: qty 1

## 2016-03-24 NOTE — Discharge Instructions (Signed)

## 2016-03-24 NOTE — MAU Provider Note (Signed)
History     CSN: [id number]  Arrival date and time: 03/24/16 4098   First Provider Initiated Contact with Patient 03/24/16 [phone]        Chief Complaint  Patient presents with  . Emesis During Pregnancy   HPI  Elizabeth Villanueva is a 25 y.o. [id number] at [redacted]w[redacted]d by ultrasound in office who presents with n/v. Seen in office by Dr. Juliene Villanueva 2 weeks ago; pt reports ultrasound showed IUP with EDD of 10/24/16. Reports n/v since beginning of pregnancy; has worsened over the last 3 days. Was taking zofran with minimal relief but has run out of that medication. Was prescribed Diclegis but it hasn't been approved by her insurance yet.  Has vomited 10 times in the last 24 hours. Emesis is mostly clear. Last tried to eat spaghetti last night but was unable to keep it down. Has difficulty keeping down water. Denies diarrhea, constipation, abdominal pain, vaginal bleeding, heartburn, or dysuria.    OB History    Gravida Para Term Preterm AB TAB SAB Ectopic Multiple Living   Past Medical History  Diagnosis Date  . Anemia   . Asthma   . Syncope   . History of PCOS     Past Surgical History  Procedure Laterality Date  . No past surgeries      Family History  Problem Relation Age of Onset  . Cancer Mother   . Hyperlipidemia Maternal Grandmother   . Cancer Maternal Grandmother     brain    Social History  Substance Use Topics  . Smoking status: Never Smoker   . Smokeless tobacco: None  . Alcohol Use: No    Allergies:  Allergies  Allergen Reactions  . Fish Allergy Anaphylaxis    Prescriptions prior to admission  Medication Sig Dispense Refill Last Dose  . ondansetron (ZOFRAN-ODT) 4 MG disintegrating tablet Take 4 mg by mouth every 8 (eight) hours as needed for nausea or vomiting.   03/22/2016  . Prenatal Vit-Fe Fumarate-FA (PRENATAL MULTIVITAMIN) TABS tablet Take 1 tablet by mouth daily at 12 noon.   03/23/2016 at Unknown time  . [DISCONTINUED] ibuprofen (ADVIL,MOTRIN)  600 MG tablet Take 1 tablet (600 mg total) by mouth every 6 (six) hours. (Patient not taking: Reported on 03/24/2016) 30 tablet 0     Review of Systems  Constitutional: Negative.   Gastrointestinal: Positive for nausea and vomiting. Negative for heartburn, abdominal pain, diarrhea and constipation.  Genitourinary: Negative.    Physical Exam   Blood pressure 123/82, pulse 83, temperature 98.3 F (36.8 C), temperature source Oral, resp. rate 16, height  (1.676 m), weight 124 lb 6.4 oz (56.427 kg), last menstrual period 01/11/2016, unknown if currently breastfeeding.  Physical Exam  Nursing note and vitals reviewed. Constitutional: She is oriented to person, place, and time. She appears well-developed and well-nourished. No distress.  HENT:  Head: Normocephalic and atraumatic.  Eyes: Conjunctivae are normal. Right eye exhibits no discharge. Left eye exhibits no discharge. No scleral icterus.  Neck: Normal range of motion.  Cardiovascular: Normal rate, regular rhythm and normal heart sounds.   No murmur heard. Respiratory: Effort normal and breath sounds normal. No respiratory distress. She has no wheezes.  GI: Soft. Bowel sounds are normal. She exhibits no distension. There is no tenderness.  Neurological: She is alert and oriented to person, place, and time.  Skin: Skin is warm and dry. She is not diaphoretic.  Psychiatric: She has a normal mood and affect. Her behavior is normal. Judgment and thought content normal.    MAU Course  Procedures Results for orders placed or performed during the hospital encounter of 03/24/16 (from the past 24 hour(s))  Urinalysis, Routine w reflex microscopic (not at [facility])     Status: Abnormal   Collection Time: 03/24/16  9:03 AM  Result Value Ref Range   Color, Urine YELLOW YELLOW   APPearance CLEAR CLEAR   Specific Gravity, Urine 1.025 1.005 - 1.030   pH 6.0 5.0 - 8.0   Glucose, UA NEGATIVE NEGATIVE mg/dL   Hgb urine dipstick NEGATIVE  NEGATIVE   Bilirubin Urine NEGATIVE NEGATIVE   Ketones, ur 15 (A) NEGATIVE mg/dL   Protein, ur 30 (A) NEGATIVE mg/dL   Nitrite NEGATIVE NEGATIVE   Leukocytes, UA NEGATIVE NEGATIVE  Urine microscopic-add on     Status: Abnormal   Collection Time: 03/24/16  9:03 AM  Result Value Ref Range   Squamous Epithelial / LPF 0-5 (A) NONE SEEN   WBC, UA 0-5 0 - 5 WBC/hpf   RBC / HPF NONE SEEN 0 - 5 RBC/hpf   Bacteria, UA FEW (A) NONE SEEN   Urine-Other MUCOUS PRESENT   Pregnancy, urine POC     Status: Abnormal   Collection Time: 03/24/16  9:19 AM  Result Value Ref Range   Preg Test, Ur POSITIVE (A) NEGATIVE  CBC     Status: None   Collection Time: 03/24/16 10:20 AM  Result Value Ref Range   WBC 6.2 4.0 - 10.5 K/uL   RBC 4.14 3.87 - 5.11 MIL/uL   Hemoglobin 12.5 12.0 - 15.0 g/dL   HCT 11.936.0 14.736.0 - 82.946.0 %   MCV 87.0 78.0 - 100.0 fL   MCH 30.2 26.0 - 34.0 pg   MCHC 34.7 30.0 - 36.0 g/dL   RDW 56.213.4 13.011.5 - 86.515.5 %   Platelets 198 150 - 400 K/uL  Comprehensive metabolic panel     Status: Abnormal   Collection Time: 03/24/16 10:20 AM  Result Value Ref Range   Sodium 136 135 - 145 mmol/L   Potassium 3.9 3.5 - 5.1 mmol/L   Chloride 107 101 - 111 mmol/L   CO2 24 22 - 32 mmol/L   Glucose, Bld 86 65 - 99 mg/dL   BUN 8 6 - 20 mg/dL   Creatinine, Ser 7.840.40 (L) 0.44 - 1.00 mg/dL   Calcium 9.4 8.9 - 69.610.3 mg/dL   Total Protein 7.3 6.5 - 8.1 g/dL   Albumin 4.4 3.5 - 5.0 g/dL   AST 18 15 - 41 U/L   ALT 9 (L) 14 - 54 U/L   Alkaline Phosphatase 43 38 - 126 U/L   Total Bilirubin 0.6 0.3 - 1.2 mg/dL   GFR calc non Af Amer >60 >60 mL/min   GFR calc Af Amer >60 >60 mL/min   Anion gap 5 5 - 15    MDM Phenergan 25 mg IV in bag of D5LR Pt not observed vomiting while in MAU & able to tolerate ginger ale with ice 1135-  S/w Dr. Billy Villanueva regarding patient. Ok to discharge home with rx for phenergan & f/u in office as scheduled.  Assessment and Plan  A: 1. Nausea and vomiting during pregnancy      P; Discharge home Rx phenergan Keep scheduled f/u with Dr. Juliene PinaMody Discussed appropriate diet for n/v in pregnancy  Elizabeth Hornrin Elizabeth Villanueva 03/24/2016, 9:54 AM

## 2016-03-24 NOTE — MAU Note (Addendum)
C/o N&V throught pregnancy so far; c/o abdominal cramping since around 1830 last night; no vaginal bleeding;c/o small amount of creamy white discharge for past 3 days; has tried diclegis and zofran for nausea but neither is working;

## 2016-03-24 NOTE — MAU Note (Signed)
Pt C/O vomiting entire pregnancy, worse for the past 2 days, unable to hold down water.  Taking zofran & diclegis, not working.  Has HA, mild abdominal cramping.  Denies bleeding.

## 2017-01-27 ENCOUNTER — Encounter (HOSPITAL_COMMUNITY): Payer: Self-pay
